# Patient Record
Sex: Male | Born: 1950
Health system: Southern US, Community
[De-identification: ages and names within clinical notes are randomized; demographics above are authoritative.]

## PROBLEM LIST (undated history)

## (undated) DIAGNOSIS — J449 Chronic obstructive pulmonary disease, unspecified: Secondary | ICD-10-CM

## (undated) DIAGNOSIS — A630 Anogenital (venereal) warts: Secondary | ICD-10-CM

## (undated) DIAGNOSIS — Z720 Tobacco use: Secondary | ICD-10-CM

## (undated) HISTORY — DX: Chronic obstructive pulmonary disease, unspecified: J44.9

## (undated) HISTORY — DX: Tobacco use: Z72.0

## (undated) HISTORY — DX: Anogenital (venereal) warts: A63.0

---

## 2000-02-02 HISTORY — PX: NOSE SURGERY: SHX723

## 2007-03-30 ENCOUNTER — Ambulatory Visit: Payer: Self-pay | Admitting: Family Medicine

## 2007-03-30 DIAGNOSIS — S43439A Superior glenoid labrum lesion of unspecified shoulder, initial encounter: Secondary | ICD-10-CM | POA: Insufficient documentation

## 2007-04-11 ENCOUNTER — Encounter: Payer: Self-pay | Admitting: Family Medicine

## 2007-04-13 ENCOUNTER — Ambulatory Visit: Payer: Self-pay | Admitting: Family Medicine

## 2007-04-18 ENCOUNTER — Telehealth (INDEPENDENT_AMBULATORY_CARE_PROVIDER_SITE_OTHER): Payer: Self-pay | Admitting: *Deleted

## 2007-04-20 ENCOUNTER — Encounter: Admission: RE | Admit: 2007-04-20 | Discharge: 2007-04-20 | Payer: Self-pay | Admitting: Sports Medicine

## 2007-04-20 ENCOUNTER — Encounter: Payer: Self-pay | Admitting: Family Medicine

## 2007-04-22 ENCOUNTER — Encounter: Admission: RE | Admit: 2007-04-22 | Discharge: 2007-04-22 | Payer: Self-pay | Admitting: Sports Medicine

## 2007-05-03 ENCOUNTER — Encounter: Payer: Self-pay | Admitting: Family Medicine

## 2007-05-05 ENCOUNTER — Encounter: Admission: RE | Admit: 2007-05-05 | Discharge: 2007-06-23 | Payer: Self-pay | Admitting: Sports Medicine

## 2007-05-08 ENCOUNTER — Encounter: Payer: Self-pay | Admitting: Family Medicine

## 2007-05-08 DIAGNOSIS — M7500 Adhesive capsulitis of unspecified shoulder: Secondary | ICD-10-CM | POA: Insufficient documentation

## 2008-01-31 ENCOUNTER — Ambulatory Visit: Payer: Self-pay | Admitting: Family Medicine

## 2008-12-05 ENCOUNTER — Ambulatory Visit: Payer: Self-pay | Admitting: Family Medicine

## 2008-12-25 ENCOUNTER — Ambulatory Visit: Payer: Self-pay | Admitting: Family Medicine

## 2008-12-25 DIAGNOSIS — D126 Benign neoplasm of colon, unspecified: Secondary | ICD-10-CM | POA: Insufficient documentation

## 2009-01-01 ENCOUNTER — Encounter: Payer: Self-pay | Admitting: Family Medicine

## 2009-01-01 LAB — CONVERTED CEMR LAB
Albumin: 4.7 g/dL (ref 3.5–5.2)
Alkaline Phosphatase: 68 units/L (ref 39–117)
BUN: 12 mg/dL (ref 6–23)
Glucose, Bld: 87 mg/dL (ref 70–99)
HDL: 63 mg/dL (ref >39)
LDL Cholesterol: 144 mg/dL — ABNORMAL HIGH (ref 0–99)
Potassium: 4.8 meq/L (ref 3.5–5.3)
Triglycerides: 66 mg/dL (ref <150)

## 2009-02-21 ENCOUNTER — Encounter: Payer: Self-pay | Admitting: Family Medicine

## 2009-02-26 ENCOUNTER — Encounter: Payer: Self-pay | Admitting: Family Medicine

## 2009-02-27 ENCOUNTER — Telehealth: Payer: Self-pay | Admitting: Family Medicine

## 2009-03-12 ENCOUNTER — Encounter: Payer: Self-pay | Admitting: Family Medicine

## 2009-03-12 DIAGNOSIS — A630 Anogenital (venereal) warts: Secondary | ICD-10-CM | POA: Insufficient documentation

## 2009-03-12 DIAGNOSIS — K5730 Diverticulosis of large intestine without perforation or abscess without bleeding: Secondary | ICD-10-CM | POA: Insufficient documentation

## 2010-03-03 NOTE — Procedures (Signed)
Summary: Colonoscopy/[facility]  Colonoscopy/[facility]   Imported By: Lanelle Bal 03/13/2009 13:04:15  _____________________________________________________________________  External Attachment:    Type:   Image     Comment:   External Document

## 2010-03-03 NOTE — Miscellaneous (Signed)
Summary: colonoscopy: polyps  Clinical Lists Changes  Problems: Removed problem of HEALTH MAINTENANCE EXAM (ICD-V70.0) Removed problem of OTH&UNSPEC ENDOCRN NUTRIT METAB&IMMUNITY D/O (ICD-V77.99) Removed problem of SCREENING FOR LIPOID DISORDERS (ICD-V77.91) Removed problem of HERPANGINA (ICD-074.0) Removed problem of NEED PROPHYLACTIC VACCINATION&INOCULATION FLU (ICD-V04.81) Removed problem of NEVUS, ATYPICAL (ICD-216.9) Added new problem of DIVERTICULOSIS OF COLON (ICD-562.10) Added new problem of CONDYLOMA ACUMINATA, ANAL (ICD-078.11) Observations: Added new observation of COLONRECACT: Repeat colonoscopy in 5 years.  (02/21/2009 8:15) Added new observation of COLONOSCOPY: Location:  [facility] Dr Bearl [place] -- biopsied showed condyloma acuminata sigmoid colon polyp- hyperplastic transverse colon polyp-tubular adenoma diverticulosis  (02/21/2009 8:15)      Colonoscopy  Procedure date:  02/21/2009  Findings:      Location:  [facility] Dr Bearl [place] -- biopsied showed condyloma acuminata sigmoid colon polyp- hyperplastic transverse colon polyp-tubular adenoma diverticulosis   Comments:      Repeat colonoscopy in 5 years.    Colonoscopy  Procedure date:  02/21/2009  Findings:      Location:  [facility] Dr Bearl [place] -- biopsied showed condyloma acuminata sigmoid colon polyp- hyperplastic transverse colon polyp-tubular adenoma diverticulosis   Comments:      Repeat colonoscopy in 5 years.

## 2010-03-03 NOTE — Letter (Signed)
Summary: Letter to Patient Regarding Colonoscopy & Path Results/[facility]  Letter to Patient Regarding Colonoscopy & Path Results/[facility]   Imported By: Lanelle Bal 03/13/2009 13:02:58  _____________________________________________________________________  External Attachment:    Type:   Image     Comment:   External Document

## 2010-03-03 NOTE — Progress Notes (Signed)
Summary: Smoking cessation  Phone Note Call from Patient   Caller: Patient Summary of Call: Pt and spouse would like to start smoking cessation. They would like Chantix called to pharm. Please advise . Initial call taken by: Payton Spark CMA,  February 27, 2009 3:45 PM    New/Updated Medications: CHANTIX STARTING MONTH PAK 0.5 MG X 11 & 1 MG X 42 TABS (VARENICLINE TARTRATE) take as directed Prescriptions: CHANTIX STARTING MONTH PAK 0.5 MG X 11 & 1 MG X 42 TABS (VARENICLINE TARTRATE) take as directed  #1 pack x 0   Entered and Authorized by:   Seymour Bars DO   Signed by:   Seymour Bars DO on 02/27/2009   Method used:   Electronically to        [id number]       [address]       [place], [place]  [address]       Ph: [phone]       Fax: [phone]   RxID:   [id number]

## 2010-04-09 ENCOUNTER — Other Ambulatory Visit: Payer: Self-pay | Admitting: Sports Medicine

## 2010-04-09 ENCOUNTER — Ambulatory Visit
Admission: RE | Admit: 2010-04-09 | Discharge: 2010-04-09 | Disposition: A | Discharge status: Home / Self Care | Payer: BC Managed Care – PPO | Source: Ambulatory Visit | Attending: Sports Medicine | Admitting: Sports Medicine

## 2010-04-09 DIAGNOSIS — M25562 Pain in left knee: Secondary | ICD-10-CM

## 2010-06-26 ENCOUNTER — Ambulatory Visit (INDEPENDENT_AMBULATORY_CARE_PROVIDER_SITE_OTHER): Payer: BC Managed Care – PPO | Admitting: Family Medicine

## 2010-06-26 ENCOUNTER — Encounter: Payer: Self-pay | Admitting: Family Medicine

## 2010-06-26 VITALS — BP 130/87 | HR 80 | Temp 98.5°F | Ht 72.0 in | Wt 176.0 lb

## 2010-06-26 DIAGNOSIS — L723 Sebaceous cyst: Secondary | ICD-10-CM

## 2010-06-26 DIAGNOSIS — L729 Follicular cyst of the skin and subcutaneous tissue, unspecified: Secondary | ICD-10-CM

## 2010-06-26 MED ORDER — IMIQUIMOD 5 % EX CREA: TOPICAL_CREAM | CUTANEOUS | Status: DC

## 2010-06-26 NOTE — Progress Notes (Signed)
  Subjective:    Patient ID: Bobby Lewis, male    DOB: [birth year], 60 y.o.   MRN: [id number]  HPI 60 yo WM presents for a spot on the bottom of his R foot that he thinks has been there for a year.  It is not painful and he can stand and walk w/o a problem.  He thinks it has grown in size.  Denies skin color changes, etc.  O/w doing well.  Quit smoking 2 yrs ago.  BP 130/87  Pulse 80  Temp(Src) 98.5 F (36.9 C) (Oral)  Ht 6' (1.829 m)  Wt 176 lb (79.833 kg)  BMI 23.87 kg/m2  SpO2 96%     Review of Systems as per HPI     Objective:   Physical Exam   Ext:  R plantar surface of the foot with a cystic type mass that is non-tender and about nickel sized just lateral to the median arch.  Well demarcated and slightly hard.  No overlying callus or ulceration.  No change in skin coloration.  Duputren's contracture R hand.  Assessment & Plan:  Cyst on Foot- Non tender and unlikely to be anything worrisome but will have Dr Yates Decamp take a look at it to see if it needs removal. Will f/u for his hand, labs and a physical in 1 month.

## 2010-06-26 NOTE — Patient Instructions (Signed)
Will get you in with Dr Yates Decamp for ? Cyst on foot.  Return for physical with fasting labs in 1 month.

## 2010-07-21 ENCOUNTER — Encounter: Payer: Self-pay | Admitting: Family Medicine

## 2010-07-22 ENCOUNTER — Encounter: Payer: Self-pay | Admitting: Family Medicine

## 2010-07-22 ENCOUNTER — Ambulatory Visit (INDEPENDENT_AMBULATORY_CARE_PROVIDER_SITE_OTHER): Payer: BC Managed Care – PPO | Admitting: Family Medicine

## 2010-07-22 DIAGNOSIS — Z125 Encounter for screening for malignant neoplasm of prostate: Secondary | ICD-10-CM

## 2010-07-22 DIAGNOSIS — Z13228 Encounter for screening for other metabolic disorders: Secondary | ICD-10-CM

## 2010-07-22 DIAGNOSIS — Z130 Encounter for screening for diseases of the blood and blood-forming organs and certain disorders involving the immune mechanism: Secondary | ICD-10-CM

## 2010-07-22 DIAGNOSIS — Z0000 Encounter for general adult medical examination without abnormal findings: Secondary | ICD-10-CM

## 2010-07-22 DIAGNOSIS — Z13220 Encounter for screening for lipoid disorders: Secondary | ICD-10-CM

## 2010-07-22 NOTE — Patient Instructions (Signed)
Update fasting labs. Will call you w/ results.

## 2010-07-22 NOTE — Progress Notes (Signed)
  Subjective:    Patient ID: Bobby Lewis, male    DOB: 1950/08/12, 60 y.o.   MRN: [id number]  HPI 60 yo WM presents for CPE.  He is healthy w/o any complaints.  He denies a fam hx of premature heart disease, prostate cancer or colon cancer.    His last labwork was > 1 yr ago. His last tetanus vaccine was >10 yrs ago. His last colonoscopy was in 2011 and was given 5 yrs to f/u.  He recently quit smoking and is doing rather well off cigarettes.  He is seeing Dr Yates Decamp now for R foot pain.  He is physically active with a fairly healthy diet.  He had a CXR done 1-2 yrs ago and cardiac testing a few years ago prior to coming here that was 'normal'.    BP 132/80  Pulse 78  Ht 6' (1.829 m)  Wt 176 lb (79.833 kg)  BMI 23.87 kg/m2  SpO2 96%    Review of Systems Gen: no fevers, chills, hot flashes, night sweats, change in weight GI: no N/V/C/D GU: no dysuria, incontinence or sexual dysfunction CV: no chest pain, DOE, palpitations s or edema Pulm:  Denies CP, SOB or chronic cough      Objective:   Physical Exam  Genitourinary: Guaiac negative stool. Prostate is enlarged (2+). Prostate is not tender.       Anal condyloma      Gen: alert, well groomed in NAD Neck: no thyromegaly or cervical lymphadenopathy CV: RRR w/o murmur, no audible carotid bruits or abdominal aortic bruits Ext: no edema, clubbing or cyanosis Lungs: CTA bilat w/o W/R/R; nonlabored HEENT:  [place]/AT; PERRLA; oropharynx pink and moist with good dentition Abd: soft, NT, ND, NABS, No HSM, no audible AA bruits Skin: warm and dry; no rash, pallor or jaundice, sun damaged skin Psych: does not appear anxious or depressed; answers questions appropriately     Assessment & Plan:  Assesment:  1. CPE- Keeping healthy checklist for men reviewed today.  BP at goal.  BMI 23 in the normal range.     Labs ordered Colonoscopy due in 4 yrs.  prostate exam done today, PSA with labs. Tdap UTD Encouraged healthy diet, regular  exercise, MVI daily. Return for next physical in 1 yr.

## 2010-07-28 LAB — LIPID PANEL: Cholesterol: 297 mg/dL — ABNORMAL HIGH (ref 0–200)

## 2010-07-29 ENCOUNTER — Other Ambulatory Visit: Payer: Self-pay | Admitting: Family Medicine

## 2010-07-29 ENCOUNTER — Telehealth: Payer: Self-pay | Admitting: Family Medicine

## 2010-07-29 DIAGNOSIS — E785 Hyperlipidemia, unspecified: Secondary | ICD-10-CM | POA: Insufficient documentation

## 2010-07-29 LAB — COMPLETE METABOLIC PANEL WITH GFR
Albumin: 4.9 g/dL (ref 3.5–5.2)
Alkaline Phosphatase: 78 U/L (ref 39–117)
BUN: 12 mg/dL (ref 6–23)
CO2: 24 mEq/L (ref 19–32)
Calcium: 9.7 mg/dL (ref 8.4–10.5)
Chloride: 106 mEq/L (ref 96–112)
GFR, Est African American: >60 mL/min (ref >60)
GFR, Est Non African American: >60 mL/min (ref >60)
Glucose, Bld: 95 mg/dL (ref 70–99)
Potassium: 4.8 mEq/L (ref 3.5–5.3)

## 2010-07-29 MED ORDER — ATORVASTATIN CALCIUM 80 MG PO TABS: 80.0000 mg | ORAL_TABLET | Freq: Every day | ORAL | Status: DC

## 2010-07-29 NOTE — Telephone Encounter (Signed)
Pls let pt know that his fasting sugar, liver and kidney function came back normal.  His cholesterol is very high at 297 with high TGs of 193 and a very high LDL bad cholesterol of 207.  This should be < 130.  Prostate cancer screen is normal.  We need to start him on cholesterol reducing medication to reduce risk for heart attack and stroke and recheck labs in 8 wks.  Let me know if any questions.  RX will be sent to pharmacy.  Take at bedtime daily.

## 2010-07-31 NOTE — Telephone Encounter (Signed)
LMOM for Pt to CB 

## 2010-07-31 NOTE — Telephone Encounter (Signed)
Pt aware of the below

## 2010-10-26 ENCOUNTER — Telehealth: Payer: Self-pay | Admitting: Family Medicine

## 2010-10-26 DIAGNOSIS — E785 Hyperlipidemia, unspecified: Secondary | ICD-10-CM

## 2010-10-26 NOTE — Telephone Encounter (Signed)
Pt's wife calling saying he needs lab orders.  Was told he needed updated labwork. Plan:  Pt wife notified that lab order printed and faxed to [facility] for the fasting lipid profile. Jarvis Newcomer, LPN [facility]

## 2010-10-27 LAB — LIPID PANEL
Cholesterol: 254 mg/dL — ABNORMAL HIGH (ref 0–200)
HDL: 52 mg/dL (ref >39)
Total CHOL/HDL Ratio: 4.9 Ratio
Triglycerides: 134 mg/dL (ref <150)
VLDL: 27 mg/dL (ref 0–40)

## 2010-10-28 ENCOUNTER — Telehealth: Payer: Self-pay | Admitting: *Deleted

## 2010-10-28 MED ORDER — PITAVASTATIN CALCIUM 4 MG PO TABS: 4.0000 mg | ORAL_TABLET | Freq: Every day | ORAL | Status: DC

## 2010-10-28 NOTE — Telephone Encounter (Signed)
Message copied by Lanae Crumbly on Wed Oct 28, 2010  8:50 AM ------      Message from: Nani Gasser D      Created: Wed Oct 28, 2010  7:58 AM       LDL chol is better at 175 but still needs to be under 100. Will change to livalo.  Can pick up samples and coupon and we can send a new rx and recheck levels in 8 weeks. OK to send livalo 4mg  daily at bedtime.

## 2010-10-28 NOTE — Telephone Encounter (Signed)
Pt called for his lab results.   Plan:  Pt informed LDL still too elevated.  Pt informed to pup livalo 4 mg # 7 samples and to pup coupon card to take to pharm to put with script that has already been sent.   Pt concerned about the cost of the chol med.  Told have no idea of cost until its ran.  Told pt he doesn't have to accept the sccipt if too expensive after using the coupon card.  Pt informed to repeat the levels in 8 weeks. Bobby Newcomer, LPN [facility]

## 2010-10-28 NOTE — Telephone Encounter (Signed)
LMOM for pt to return call. 

## 2010-11-03 ENCOUNTER — Telehealth: Payer: Self-pay | Admitting: Family Medicine

## 2010-11-03 NOTE — Telephone Encounter (Signed)
Pt's wife called and said the pharm does not have the pt livalo script that was sent in on 10-28-10 for # 7. Plan:  Called the pharm and gave verbal order. Jarvis Newcomer, LPN [facility]

## 2010-11-04 ENCOUNTER — Telehealth: Payer: Self-pay | Admitting: Family Medicine

## 2010-11-04 MED ORDER — PITAVASTATIN CALCIUM 4 MG PO TABS: 4.0000 mg | ORAL_TABLET | Freq: Every day | ORAL | Status: DC

## 2010-11-04 NOTE — Telephone Encounter (Signed)
[facility] called and left message on the triage nurse voice line stating that pt coupon for the livalo 4 mg is for 30 days and not 7 days and they need the prescription changed. Plan:  Previous script discontinued due to change and a new script for # 30/ 0 refills was sent electronically. Jarvis Newcomer, LPN [facility]

## 2010-11-06 ENCOUNTER — Telehealth: Payer: Self-pay | Admitting: Family Medicine

## 2010-11-06 NOTE — Telephone Encounter (Signed)
Pt's wife called and said her husband was given new script for livalo for his cholesterol.  Cost is $29.00 for 7 day supply.  Pt cannot afford, and the wife wants something cheaper prescribed. Jarvis Newcomer, LPN [facility]

## 2010-11-06 NOTE — Telephone Encounter (Signed)
Did they use the coupon card?

## 2010-11-09 ENCOUNTER — Telehealth: Payer: Self-pay | Admitting: Family Medicine

## 2010-11-09 MED ORDER — ATORVASTATIN CALCIUM 80 MG PO TABS: 80.0000 mg | ORAL_TABLET | Freq: Every day | ORAL | Status: DC

## 2010-11-09 NOTE — Telephone Encounter (Signed)
LMOM for the pt's wife instructing her to call with details if used livalo coupon or not. Pending pt call back. Jarvis Newcomer, LPN [facility]

## 2010-11-09 NOTE — Telephone Encounter (Signed)
Pt's wife notified and told to check with the pharm later and that atorvastatin generic lipitor had been sent to pharm. Jarvis Newcomer, LPN [facility]

## 2010-11-09 NOTE — Telephone Encounter (Signed)
Being off the med for that long changes everything. That is why i wanted to use a stronger med because I thought he was already on his lipitor. Simvastatin won't be strong enough to bring his LDL down by 40%. REally needs a lipitor or crestor or livalo.Lipitor now comes generic so Ok fo 80mg  daily and then recheck chol in 3 mo on the med.

## 2010-11-09 NOTE — Telephone Encounter (Signed)
Pt's wife called back and said they did use the coupon and even with 30 day supply the cost of med per mth is $18.00, and that is still more than she wants to pay for a 30 day script.  Would like a med that the copay is going to be $5.00.  Pt's wife suggest simvastatin.  That is what she takes.  Also, pt's wife states when pt had labs drawn he had let hisself run out of medication 1 mth  or longer prior to doing labwork. Plan:  Routed this encounter to the provider. Jarvis Newcomer, LPN [facility]

## 2010-11-10 NOTE — Telephone Encounter (Signed)
Closed

## 2011-01-08 ENCOUNTER — Other Ambulatory Visit: Payer: Self-pay | Admitting: Family Medicine

## 2011-03-08 ENCOUNTER — Other Ambulatory Visit: Payer: Self-pay | Admitting: Family Medicine

## 2011-05-11 ENCOUNTER — Other Ambulatory Visit: Payer: Self-pay | Admitting: Family Medicine

## 2011-05-27 ENCOUNTER — Other Ambulatory Visit: Payer: Self-pay | Admitting: Family Medicine

## 2011-07-05 ENCOUNTER — Other Ambulatory Visit: Payer: Self-pay | Admitting: Family Medicine

## 2011-08-08 ENCOUNTER — Other Ambulatory Visit: Payer: Self-pay | Admitting: Family Medicine

## 2011-08-09 NOTE — Telephone Encounter (Signed)
Needs appoinment

## 2011-08-29 ENCOUNTER — Other Ambulatory Visit: Payer: Self-pay | Admitting: Family Medicine

## 2011-08-30 NOTE — Telephone Encounter (Signed)
Must make appointment before any further refills 

## 2011-09-05 ENCOUNTER — Other Ambulatory Visit: Payer: Self-pay | Admitting: Family Medicine

## 2011-09-06 ENCOUNTER — Other Ambulatory Visit: Payer: Self-pay | Admitting: Family Medicine

## 2011-10-11 ENCOUNTER — Other Ambulatory Visit: Payer: Self-pay | Admitting: Family Medicine

## 2011-10-13 ENCOUNTER — Other Ambulatory Visit: Payer: Self-pay | Admitting: Family Medicine

## 2011-10-16 ENCOUNTER — Other Ambulatory Visit: Payer: Self-pay | Admitting: Family Medicine

## 2011-10-18 ENCOUNTER — Ambulatory Visit (INDEPENDENT_AMBULATORY_CARE_PROVIDER_SITE_OTHER): Payer: BC Managed Care – PPO | Admitting: Family Medicine

## 2011-10-18 ENCOUNTER — Encounter: Payer: Self-pay | Admitting: Family Medicine

## 2011-10-18 VITALS — BP 154/91 | HR 64 | Wt 185.0 lb

## 2011-10-18 DIAGNOSIS — R030 Elevated blood-pressure reading, without diagnosis of hypertension: Secondary | ICD-10-CM

## 2011-10-18 DIAGNOSIS — IMO0001 Reserved for inherently not codable concepts without codable children: Secondary | ICD-10-CM

## 2011-10-18 DIAGNOSIS — Z23 Encounter for immunization: Secondary | ICD-10-CM

## 2011-10-18 DIAGNOSIS — E785 Hyperlipidemia, unspecified: Secondary | ICD-10-CM

## 2011-10-18 MED ORDER — ATORVASTATIN CALCIUM 80 MG PO TABS: 80.0000 mg | ORAL_TABLET | Freq: Every day | ORAL | Status: DC

## 2011-10-18 NOTE — Progress Notes (Signed)
  Subjective:    Patient ID: Bobby Lewis, male    DOB: 1950-10-03, 61 y.o.   MRN: [id number]  HPI Hyperlipidemia  - Out of meds for now.  Was told needed appt. No CP or SOB.  He had not been seen since 2011. He otherwise tolerates his medications well and denies any myalgias on the atorvastatin. He was evidently switched a Livalo last fall but because of cost will switch back to atorvastatin 80 mg. He says even try to use a coupon card for Livalo but was still too expensive.   Review of Systems     Objective:   Physical Exam  Constitutional: He is oriented to person, place, and time. He appears well-developed and well-nourished.  HENT:  Head: Normocephalic and atraumatic.  Cardiovascular: Normal rate, regular rhythm and normal heart sounds.   Pulmonary/Chest: Effort normal and breath sounds normal.  Neurological: He is alert and oriented to person, place, and time.  Skin: Skin is warm and dry.  Psychiatric: He has a normal mood and affect. His behavior is normal.          Assessment & Plan:  Hyperlpidemia - Restart lipitor. Discussed increased risk of myalgias on the this dose. Says couldn't afford the livalo.  Restart med and then go in 2 weeks to check lipids and CMP.    Elevated BP- High today. REpeat BP in 2 weeks with nurse visit.    Flu shot and shingle vaccine given today.

## 2011-12-01 ENCOUNTER — Ambulatory Visit (INDEPENDENT_AMBULATORY_CARE_PROVIDER_SITE_OTHER): Payer: BC Managed Care – PPO | Admitting: Family Medicine

## 2011-12-01 VITALS — BP 137/76 | HR 66 | Ht 72.0 in | Wt 191.0 lb

## 2011-12-01 DIAGNOSIS — I10 Essential (primary) hypertension: Secondary | ICD-10-CM

## 2011-12-01 LAB — COMPLETE METABOLIC PANEL WITH GFR
AST: 24 U/L (ref 0–37)
Alkaline Phosphatase: 69 U/L (ref 39–117)
BUN: 13 mg/dL (ref 6–23)
Calcium: 9.7 mg/dL (ref 8.4–10.5)
Creat: 0.92 mg/dL (ref 0.50–1.35)
GFR, Est Non African American: >89 mL/min
Glucose, Bld: 95 mg/dL (ref 70–99)

## 2011-12-01 LAB — LIPID PANEL
Cholesterol: 149 mg/dL (ref 0–200)
Total CHOL/HDL Ratio: 3.3 Ratio
Triglycerides: 213 mg/dL — ABNORMAL HIGH (ref <150)
VLDL: 43 mg/dL — ABNORMAL HIGH (ref 0–40)

## 2011-12-01 NOTE — Progress Notes (Signed)
Patient ID: Bobby Lewis, male   DOB: 04/18/1950, 61 y.o.   MRN: [id number]  Pt denies chest pain, SOB, dizziness, or heart palpitations.   5 min spent with pt.   HTN- blood pressure does look better today. We will continue to monitor. Recommend repeat in 6 months. Also please make sure that he is going to the lab to recheck his cholesterol since restarting his Lipitor. Nani Gasser, MD

## 2011-12-01 NOTE — Progress Notes (Signed)
Patient ID: Bobby Lewis, male   DOB: Nov 28, 1950, 61 y.o.   MRN: [id number] Pt aware

## 2012-01-03 ENCOUNTER — Telehealth: Payer: Self-pay | Admitting: *Deleted

## 2012-01-03 MED ORDER — OMEPRAZOLE 20 MG PO TBEC: 1.0000 | DELAYED_RELEASE_TABLET | Freq: Every day | ORAL | Status: DC

## 2012-01-03 NOTE — Telephone Encounter (Signed)
Pt states having some heartburn lateley and has used in the past tums, etc. Wife picked up Omeprazole OTC and this helped a lot. Request a rx be sent in  For this because this will save him some money. [facility]ses [facility]

## 2012-01-03 NOTE — Telephone Encounter (Signed)
rx sent

## 2012-01-12 ENCOUNTER — Other Ambulatory Visit: Payer: Self-pay | Admitting: Family Medicine

## 2012-02-13 ENCOUNTER — Other Ambulatory Visit: Payer: Self-pay | Admitting: Family Medicine

## 2012-03-14 ENCOUNTER — Other Ambulatory Visit: Payer: Self-pay | Admitting: Family Medicine

## 2012-04-15 ENCOUNTER — Other Ambulatory Visit: Payer: Self-pay | Admitting: Family Medicine

## 2012-05-16 ENCOUNTER — Other Ambulatory Visit: Payer: Self-pay | Admitting: Family Medicine

## 2012-05-29 ENCOUNTER — Other Ambulatory Visit: Payer: Self-pay | Admitting: Family Medicine

## 2012-06-11 ENCOUNTER — Other Ambulatory Visit: Payer: Self-pay | Admitting: Family Medicine

## 2012-06-12 NOTE — Telephone Encounter (Signed)
Needs appointment

## 2012-07-19 ENCOUNTER — Other Ambulatory Visit: Payer: Self-pay | Admitting: *Deleted

## 2012-07-19 MED ORDER — ATORVASTATIN CALCIUM 80 MG PO TABS: 80.0000 mg | ORAL_TABLET | Freq: Every day | ORAL | Status: DC

## 2012-08-01 ENCOUNTER — Encounter: Payer: Self-pay | Admitting: Family Medicine

## 2012-08-01 ENCOUNTER — Ambulatory Visit (INDEPENDENT_AMBULATORY_CARE_PROVIDER_SITE_OTHER): Payer: BC Managed Care – PPO | Admitting: Family Medicine

## 2012-08-01 VITALS — BP 126/82 | HR 54 | Ht 72.0 in | Wt 190.0 lb

## 2012-08-01 DIAGNOSIS — M25879 Other specified joint disorders, unspecified ankle and foot: Secondary | ICD-10-CM

## 2012-08-01 DIAGNOSIS — E785 Hyperlipidemia, unspecified: Secondary | ICD-10-CM

## 2012-08-01 DIAGNOSIS — K219 Gastro-esophageal reflux disease without esophagitis: Secondary | ICD-10-CM

## 2012-08-01 MED ORDER — ATORVASTATIN CALCIUM 80 MG PO TABS: 80.0000 mg | ORAL_TABLET | Freq: Every day | ORAL | Status: DC

## 2012-08-01 NOTE — Progress Notes (Signed)
  Subjective:    Patient ID: Bobby Lewis, male    DOB: 1950/12/12, 62 y.o.   MRN: [id number]  HPI Hyperlipidemia - Pt denies chest pain, SOB, dizziness, or heart palpitations.  Taking meds as directed w/o problems.  Denies medication side effects.  Does have some MSk pain but says exercise seem to help.   Had cyst on the right foot near the ball of his foot.  Has been seen by podiatry twice. Once a few years ago. Did have an [place] that showed a cyst  Once had an injection and not sure if helped or not.  His wife thinks it may be larger. Says has bothered him more since has been working out regularly.   GERd - well controlled prilosec. On it daily.  No breakthrough sxs.   Review of Systems     Objective:   Physical Exam  Constitutional: He is oriented to person, place, and time. He appears well-developed and well-nourished.  HENT:  Head: Normocephalic and atraumatic.  Cardiovascular: Normal rate, regular rhythm and normal heart sounds.   Pulmonary/Chest: Effort normal and breath sounds normal.  Musculoskeletal:  approx 2 cm cyst on the bottom of the right foot. Nontender. No erythema. Firm but not rock hard.  Neurological: He is alert and oriented to person, place, and time.  Skin: Skin is warm and dry.  Psychiatric: He has a normal mood and affect. His behavior is normal.          Assessment & Plan:  Hyperlipidemia - Due to recheck liver and lipids.  Given labslip today.  Cyst on foot-Refer back to podiatry to eval for excision.    GERD  - well controlled.    Has CPE schedule next week.

## 2012-08-10 LAB — COMPLETE METABOLIC PANEL WITH GFR
Albumin: 4.9 g/dL (ref 3.5–5.2)
Alkaline Phosphatase: 58 U/L (ref 39–117)
CO2: 27 mEq/L (ref 19–32)
Calcium: 9.9 mg/dL (ref 8.4–10.5)
Chloride: 106 mEq/L (ref 96–112)
GFR, Est African American: 87 mL/min
GFR, Est Non African American: 75 mL/min
Glucose, Bld: 93 mg/dL (ref 70–99)
Potassium: 5 mEq/L (ref 3.5–5.3)
Sodium: 142 mEq/L (ref 135–145)
Total Protein: 7.1 g/dL (ref 6.0–8.3)

## 2012-08-10 LAB — LIPID PANEL: Total CHOL/HDL Ratio: 3 Ratio

## 2012-08-11 ENCOUNTER — Encounter: Payer: Self-pay | Admitting: Family Medicine

## 2012-08-11 NOTE — Progress Notes (Signed)
Quick Note:  All labs are normal. ______ 

## 2012-08-14 ENCOUNTER — Other Ambulatory Visit: Payer: Self-pay | Admitting: Family Medicine

## 2012-08-15 ENCOUNTER — Ambulatory Visit (INDEPENDENT_AMBULATORY_CARE_PROVIDER_SITE_OTHER): Payer: BC Managed Care – PPO | Admitting: Family Medicine

## 2012-08-15 ENCOUNTER — Encounter: Payer: Self-pay | Admitting: Family Medicine

## 2012-08-15 VITALS — BP 144/86 | HR 57 | Ht 72.0 in | Wt 189.0 lb

## 2012-08-15 DIAGNOSIS — Z125 Encounter for screening for malignant neoplasm of prostate: Secondary | ICD-10-CM

## 2012-08-15 DIAGNOSIS — Z0000 Encounter for general adult medical examination without abnormal findings: Secondary | ICD-10-CM

## 2012-08-15 DIAGNOSIS — Z23 Encounter for immunization: Secondary | ICD-10-CM

## 2012-08-15 MED ORDER — IMIQUIMOD 5 % EX CREA: TOPICAL_CREAM | CUTANEOUS | Status: DC

## 2012-08-15 NOTE — Progress Notes (Signed)
Subjective:    Patient ID: Bobby Lewis, male    DOB: [birth year], 62 y.o.   MRN: [id number]  HPI Here for CPE today.  No specific concerns. Wants to go over labs.  He is taking his statin and tolerating it well. He does request a refill on his Aldara. He is taking his statin as well as superficial tabs daily. He's also taking a baby aspirin. He does use his reflux medication fairly frequently. He is due for his tetanus vaccine. Colonoscopy is up-to-date.   Review of Systems comprehensive ROS is neg.  BP 144/86  Pulse 57  Ht 6' (1.829 m)  Wt 189 lb (85.73 kg)  BMI 25.63 kg/m2    No Known Allergies  Past Medical History  Diagnosis Date  . Genital warts     history  . Tobacco abuse   . COPD (chronic obstructive pulmonary disease)     Past Surgical History  Procedure Laterality Date  . Nose surgery  2002    History   Social History  . Marital Status: Married    Spouse Name: N/A    Number of Children: N/A  . Years of Education: N/A   Occupational History  . Not on file.   Social History Main Topics  . Smoking status: Former Smoker -- 1.00 packs/day for 25 years    Types: Cigarettes    Quit date: 02/01/2009  . Smokeless tobacco: Not on file  . Alcohol Use: 1.5 oz/week    3 drink(s) per week     Comment: daily  . Drug Use: No  . 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   Other Topics Concern  . Not on file   Social History Narrative  . No narrative on file    Family History  Problem Relation Age of Onset  . Joint hypermobility Mother   . Heart attack Father 76    died 34    Outpatient Encounter Prescriptions as of 08/15/2012  Medication Sig Dispense Refill  . aspirin 81 MG tablet Take 81 mg by mouth daily.        Marland Kitchen atorvastatin (LIPITOR) 80 MG tablet Take 1 tablet (80 mg total) by mouth daily.  30 tablet  3  . imiquimod (ALDARA) 5 % cream        . Multiple Vitamin (MULTIVITAMIN PO) Take by mouth.        Marland Kitchen omeprazole (PRILOSEC) 20 MG capsule TAKE ONE CAPSULE BY MOUTH EVERY DAY  30 capsule  2   No facility-administered encounter medications on file as of 08/15/2012.          Objective:   Physical Exam  Constitutional: He is oriented to person, place, and time. He appears well-developed and well-nourished.  HENT:  Head: Normocephalic and atraumatic.  Right Ear: External ear normal.  Left Ear: External ear normal.  Nose: Nose normal.  Mouth/Throat: Oropharynx is clear and moist.  Eyes: Conjunctivae and EOM are normal. Pupils are equal, round, and reactive to light.  Neck: Normal range of motion. Neck supple. No thyromegaly present.  Cardiovascular: Normal rate, regular rhythm, normal heart sounds and intact distal pulses.   Pulmonary/Chest: Effort normal and breath sounds normal.  Abdominal: Soft. Bowel sounds are normal. He exhibits no distension and no mass. There is no tenderness. There is no rebound and no guarding.  Musculoskeletal: Normal range of  motion.  Lymphadenopathy:    He has no cervical adenopathy.  Neurological: He is alert and oriented to person, place, and time. He has normal reflexes.  Skin: Skin is warm and dry.  Psychiatric: He has a normal mood and affect. His behavior is normal. Judgment and thought content normal.          Assessment & Plan:  CPE -  Keep up a regular exercise program and make sure you are eating a healthy diet Try to eat 4 servings of dairy a day, or if you are lactose intolerant take a calcium with vitamin D daily.  Your vaccines are up to date.   Tdap updated today Reviewed lab results with him.  Hyperlipidemia-tolerating statin well and levels look fantastic. Recheck liver and lipids in one year.  Condyloma acuminata, anal-refill Aldara cream. He uses it as needed.   Discussed pros and cons of prostate cancer screening. He opted to have a PSA checked. Lab slip  given today he can go anytime.

## 2012-10-19 LAB — PSA: PSA: 0.25 ng/mL (ref <=4.00)

## 2012-11-09 ENCOUNTER — Ambulatory Visit (INDEPENDENT_AMBULATORY_CARE_PROVIDER_SITE_OTHER): Payer: BC Managed Care – PPO | Admitting: Family Medicine

## 2012-11-09 DIAGNOSIS — Z23 Encounter for immunization: Secondary | ICD-10-CM

## 2012-11-09 NOTE — Progress Notes (Signed)
  Subjective:    Patient ID: Bobby Lewis, male    DOB: [birth year], 62 y.o.   MRN: [id number] Flu shot given IM left deltoid.  No complications.  Donne Anon, CMA HPI    Review of Systems     Objective:   Physical Exam        Assessment & Plan:

## 2012-11-30 ENCOUNTER — Other Ambulatory Visit: Payer: Self-pay | Admitting: Family Medicine

## 2012-12-14 ENCOUNTER — Other Ambulatory Visit: Payer: Self-pay | Admitting: Family Medicine

## 2013-03-01 ENCOUNTER — Other Ambulatory Visit: Payer: Self-pay | Admitting: Family Medicine

## 2013-04-03 ENCOUNTER — Other Ambulatory Visit: Payer: Self-pay | Admitting: Family Medicine

## 2013-04-11 ENCOUNTER — Other Ambulatory Visit: Payer: Self-pay | Admitting: Family Medicine

## 2013-05-04 ENCOUNTER — Other Ambulatory Visit: Payer: Self-pay | Admitting: Family Medicine

## 2013-06-06 ENCOUNTER — Other Ambulatory Visit: Payer: Self-pay | Admitting: Family Medicine

## 2013-06-07 ENCOUNTER — Telehealth: Payer: Self-pay | Admitting: *Deleted

## 2013-06-07 ENCOUNTER — Other Ambulatory Visit: Payer: Self-pay

## 2013-06-07 MED ORDER — OMEPRAZOLE 20 MG PO CPDR: DELAYED_RELEASE_CAPSULE | ORAL | Status: DC

## 2013-06-07 MED ORDER — ATORVASTATIN CALCIUM 80 MG PO TABS
80.0000 mg | ORAL_TABLET | Freq: Every day | ORAL | Status: DC
Start: 2013-06-07 — End: 2013-08-21

## 2013-06-07 NOTE — Telephone Encounter (Signed)
Tried to call pt but number is disconnected. Trying to inform pt that we would only be sending in 2 weeks worth of medication due to needing appt.  Oscar La, LPN

## 2013-06-22 ENCOUNTER — Other Ambulatory Visit: Payer: Self-pay | Admitting: Family Medicine

## 2013-08-08 ENCOUNTER — Encounter: Payer: Self-pay | Admitting: Family Medicine

## 2013-08-15 ENCOUNTER — Other Ambulatory Visit: Payer: Self-pay | Admitting: *Deleted

## 2013-08-15 DIAGNOSIS — Z0000 Encounter for general adult medical examination without abnormal findings: Secondary | ICD-10-CM

## 2013-08-21 ENCOUNTER — Encounter: Payer: Self-pay | Admitting: Family Medicine

## 2013-08-21 ENCOUNTER — Ambulatory Visit (INDEPENDENT_AMBULATORY_CARE_PROVIDER_SITE_OTHER): Payer: BC Managed Care – PPO | Admitting: Family Medicine

## 2013-08-21 VITALS — BP 134/75 | HR 65 | Ht 72.0 in | Wt 192.0 lb

## 2013-08-21 DIAGNOSIS — K769 Liver disease, unspecified: Secondary | ICD-10-CM

## 2013-08-21 DIAGNOSIS — Z0000 Encounter for general adult medical examination without abnormal findings: Secondary | ICD-10-CM | POA: Diagnosis not present

## 2013-08-21 DIAGNOSIS — R945 Abnormal results of liver function studies: Secondary | ICD-10-CM

## 2013-08-21 LAB — COMPLETE METABOLIC PANEL WITH GFR
ALK PHOS: 56 U/L (ref 39–117)
ALT: 41 U/L (ref 0–53)
AST: 43 U/L — AB (ref 0–37)
Albumin: 4.8 g/dL (ref 3.5–5.2)
BUN: 13 mg/dL (ref 6–23)
CALCIUM: 9.8 mg/dL (ref 8.4–10.5)
CO2: 25 mEq/L (ref 19–32)
Chloride: 104 mEq/L (ref 96–112)
Creat: 1 mg/dL (ref 0.50–1.35)
GFR, Est African American: >89 mL/min
GFR, Est Non African American: 80 mL/min
Glucose, Bld: 90 mg/dL (ref 70–99)
POTASSIUM: 4.3 meq/L (ref 3.5–5.3)
SODIUM: 141 meq/L (ref 135–145)
TOTAL PROTEIN: 7 g/dL (ref 6.0–8.3)
Total Bilirubin: 0.5 mg/dL (ref 0.2–1.2)

## 2013-08-21 LAB — LIPID PANEL
CHOL/HDL RATIO: 2.8 ratio
Cholesterol: 132 mg/dL (ref 0–200)
HDL: 48 mg/dL (ref >39)
LDL Cholesterol: 61 mg/dL (ref 0–99)
Triglycerides: 115 mg/dL (ref <150)
VLDL: 23 mg/dL (ref 0–40)

## 2013-08-21 LAB — PSA: PSA: 0.34 ng/mL (ref <=4.00)

## 2013-08-21 MED ORDER — SILDENAFIL CITRATE 50 MG PO TABS: 50.0000 mg | ORAL_TABLET | Freq: Every day | ORAL | Status: DC | PRN

## 2013-08-21 MED ORDER — OMEPRAZOLE 20 MG PO CPDR: DELAYED_RELEASE_CAPSULE | ORAL | Status: DC

## 2013-08-21 MED ORDER — ATORVASTATIN CALCIUM 80 MG PO TABS
80.0000 mg | ORAL_TABLET | Freq: Every day | ORAL | Status: DC
Start: 2013-08-21 — End: 2014-03-13

## 2013-08-21 NOTE — Addendum Note (Signed)
Addended by: Beatrice Lecher D on: 08/21/2013 03:32 PM   Modules accepted: Orders

## 2013-08-21 NOTE — Progress Notes (Addendum)
Subjective:    Patient ID: Bobby Lewis, male    DOB: 1950/08/16, 63 y.o.   MRN: [id number]  HPI Here for CPE.  No specific complaints or concerns today. He has been taking his Lipitor regularly without any side effects or problems and he does take a baby aspirin daily.   Review of Systems Comprehensive review of systems is negative today.  BP 134/75  Pulse 65  Ht 6' (1.829 m)  Wt 192 lb (87.091 kg)  BMI 26.03 kg/m2    No Known Allergies  Past Medical History  Diagnosis Date  . Genital warts     history  . Tobacco abuse   . COPD (chronic obstructive pulmonary disease)     Past Surgical History  Procedure Laterality Date  . Nose surgery  2002    History   Social History  . Marital Status: Married    Spouse Name: N/A    Number of Children: N/A  . Years of Education: N/A   Occupational History  . Not on file.   Social History Main Topics  . Smoking status: Former Smoker -- 1.00 packs/day for 25 years    Types: Cigarettes    Quit date: 02/01/2009  . Smokeless tobacco: Not on file  . Alcohol Use: 1.5 oz/week    3 drink(s) per week     Comment: daily  . Drug Use: No  . 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   Other Topics Concern  . Not on file   Social History Narrative  . No narrative on file    Family History  Problem Relation Age of Onset  . Joint hypermobility Mother   . Heart attack Father 45    died 47    Outpatient Encounter Prescriptions as of 08/21/2013  Medication Sig  . aspirin 81 MG tablet Take 81 mg by mouth daily.    Marland Kitchen atorvastatin (LIPITOR) 80 MG tablet Take 1 tablet (80 mg total) by mouth daily.  . imiquimod (ALDARA) 5 % cream Apply topically 2 (two) times a week.  . Multiple Vitamin (MULTIVITAMIN PO) Take by mouth.    . Omega-3 Fatty Acids (FISH OIL) 1000 MG CAPS Take 2,000 mg by mouth daily.  Marland Kitchen omeprazole  (PRILOSEC) 20 MG capsule TAKE ONE CAPSULE BY MOUTH EVERY DAY.  . [DISCONTINUED] atorvastatin (LIPITOR) 80 MG tablet Take 1 tablet (80 mg total) by mouth daily. MUST MAKE APPOINTMENT BEFORE ANY MORE REFILLS.  . [DISCONTINUED] omeprazole (PRILOSEC) 20 MG capsule TAKE ONE CAPSULE BY MOUTH EVERY DAY. MUST MAKE APPOINTMENT FOR ANY ADDITIONAL REFILLS.          Objective:   Physical Exam  Constitutional: He is oriented to person, place, and time. He appears well-developed and well-nourished.  HENT:  Head: Normocephalic and atraumatic.  Right Ear: External ear normal.  Left Ear: External ear normal.  Nose: Nose normal.  Mouth/Throat: Oropharynx is clear and moist.  Eyes: Conjunctivae and EOM are normal. Pupils are equal, round, and reactive to light.  Neck: Normal range of motion. Neck supple. No thyromegaly present.  Cardiovascular: Normal rate, regular rhythm, normal heart sounds and intact distal pulses.   Pulmonary/Chest: Effort normal and breath sounds normal.  Abdominal: Soft. Bowel sounds are normal. He exhibits no distension and no mass. There is no tenderness. There is no rebound and no guarding.  Musculoskeletal: Normal  range of motion.  Lymphadenopathy:    He has no cervical adenopathy.  Neurological: He is alert and oriented to person, place, and time. He has normal reflexes.  Skin: Skin is warm and dry.  Psychiatric: He has a normal mood and affect. His behavior is normal. Judgment and thought content normal.          Assessment & Plan:  CPE -  Keep up a regular exercise program and make sure you are eating a healthy diet Try to eat 4 servings of dairy a day, or if you are lactose intolerant take a calcium with vitamin D daily.  Your vaccines are up to date.    Hyperlipidemia - well controlled.  Labs done yesterday.  On ASA a day.    Abnormal liver enzymes-I. did go over his blood work with him today. One of his liver enzymes is mildly elevated. He does not take any  Tylenol products but does drink one or 2 beers most nights. Encouraged him to quit and recheck his levels in 3-4 weeks.  ED - he would like to try something for erectile dysfunction. He says it's really more of a desire issue he says he says not interested but would like to try something. Will give him a sample of Viagra 100 mg to try. If it's not working and please let me know. Coupon card provided and prescription for 3 tabs in to the pharmacy.

## 2013-08-21 NOTE — Patient Instructions (Signed)
Keep up a regular exercise program and make sure you are eating a healthy diet Try to eat 4 servings of dairy a day, or if you are lactose intolerant take a calcium with vitamin D daily.  Your vaccines are up to date.   

## 2013-08-22 ENCOUNTER — Other Ambulatory Visit: Payer: Self-pay | Admitting: *Deleted

## 2013-08-22 MED ORDER — TADALAFIL 5 MG PO TABS: 5.0000 mg | ORAL_TABLET | Freq: Every day | ORAL | Status: DC | PRN

## 2013-08-23 ENCOUNTER — Encounter: Payer: Self-pay | Admitting: Family Medicine

## 2013-09-18 LAB — HEPATITIS PANEL, ACUTE
HCV AB: NEGATIVE
HEP A IGM: NONREACTIVE
Hep B C IgM: NONREACTIVE
Hepatitis B Surface Ag: NEGATIVE

## 2013-09-18 LAB — HEPATIC FUNCTION PANEL
ALK PHOS: 65 U/L (ref 39–117)
ALT: 46 U/L (ref 0–53)
AST: 34 U/L (ref 0–37)
Albumin: 4.7 g/dL (ref 3.5–5.2)
BILIRUBIN INDIRECT: 0.4 mg/dL (ref 0.2–1.2)
Bilirubin, Direct: 0.1 mg/dL (ref 0.0–0.3)
Total Bilirubin: 0.5 mg/dL (ref 0.2–1.2)
Total Protein: 7.1 g/dL (ref 6.0–8.3)

## 2013-09-19 ENCOUNTER — Encounter: Payer: Self-pay | Admitting: *Deleted

## 2013-11-01 ENCOUNTER — Ambulatory Visit (INDEPENDENT_AMBULATORY_CARE_PROVIDER_SITE_OTHER): Payer: BC Managed Care – PPO | Admitting: Family Medicine

## 2013-11-01 VITALS — Temp 97.8°F

## 2013-11-01 DIAGNOSIS — Z23 Encounter for immunization: Secondary | ICD-10-CM | POA: Diagnosis not present

## 2013-11-01 NOTE — Progress Notes (Signed)
   Subjective:    Patient ID: Bobby Lewis, male    DOB: Dec 30, 1950, 63 y.o.   MRN: [id number]  HPI Reports today for flu vaccine which he rec'd without complication. Denies fever, cough or cold symptoms recently. Margette Fast, CMA    Review of Systems     Objective:   Physical Exam        Assessment & Plan:

## 2013-11-08 ENCOUNTER — Ambulatory Visit: Payer: BC Managed Care – PPO

## 2014-03-13 ENCOUNTER — Other Ambulatory Visit: Payer: Self-pay | Admitting: Family Medicine

## 2014-04-10 ENCOUNTER — Encounter: Payer: Self-pay | Admitting: Family Medicine

## 2014-08-19 ENCOUNTER — Telehealth: Payer: Self-pay | Admitting: Family Medicine

## 2014-08-19 NOTE — Telephone Encounter (Signed)
Patient called, would like to have lab work completed prior to her upcoming appt so the results can be discussed in person.

## 2014-08-28 ENCOUNTER — Other Ambulatory Visit: Payer: Self-pay | Admitting: Family Medicine

## 2014-09-03 ENCOUNTER — Encounter: Payer: Self-pay | Admitting: Family Medicine

## 2014-09-03 DIAGNOSIS — Z114 Encounter for screening for human immunodeficiency virus [HIV]: Secondary | ICD-10-CM

## 2014-09-03 DIAGNOSIS — Z125 Encounter for screening for malignant neoplasm of prostate: Secondary | ICD-10-CM

## 2014-09-03 DIAGNOSIS — E785 Hyperlipidemia, unspecified: Secondary | ICD-10-CM

## 2014-09-03 DIAGNOSIS — Z0000 Encounter for general adult medical examination without abnormal findings: Secondary | ICD-10-CM

## 2014-09-06 NOTE — Telephone Encounter (Signed)
Orders placed and faxed. You can have these done at your convenience  .Audelia Hives [place]

## 2014-09-12 LAB — COMPLETE METABOLIC PANEL WITH GFR
ALBUMIN: 4.8 g/dL (ref 3.6–5.1)
ALT: 55 U/L — ABNORMAL HIGH (ref 9–46)
AST: 48 U/L — ABNORMAL HIGH (ref 10–35)
Alkaline Phosphatase: 55 U/L (ref 40–115)
BILIRUBIN TOTAL: 0.6 mg/dL (ref 0.2–1.2)
BUN: 20 mg/dL (ref 7–25)
CHLORIDE: 108 mmol/L (ref 98–110)
CO2: 27 mmol/L (ref 20–31)
Calcium: 9.5 mg/dL (ref 8.6–10.3)
Creat: 1.14 mg/dL (ref 0.70–1.25)
GFR, Est African American: 79 mL/min (ref >=60)
GFR, Est Non African American: 68 mL/min (ref >=60)
GLUCOSE: 92 mg/dL (ref 65–99)
Potassium: 4.2 mmol/L (ref 3.5–5.3)
Sodium: 144 mmol/L (ref 135–146)
Total Protein: 7.1 g/dL (ref 6.1–8.1)

## 2014-09-12 LAB — LIPID PANEL
Cholesterol: 134 mg/dL (ref 125–200)
HDL: 54 mg/dL (ref >=40)
LDL Cholesterol: 66 mg/dL (ref <130)
Total CHOL/HDL Ratio: 2.5 Ratio (ref <=5.0)
Triglycerides: 72 mg/dL (ref <150)
VLDL: 14 mg/dL (ref <30)

## 2014-09-13 LAB — PSA: PSA: 0.35 ng/mL (ref <=4.00)

## 2014-09-13 LAB — HIV ANTIBODY (ROUTINE TESTING W REFLEX): HIV: NONREACTIVE

## 2014-09-16 ENCOUNTER — Other Ambulatory Visit: Payer: Self-pay | Admitting: *Deleted

## 2014-09-16 DIAGNOSIS — R748 Abnormal levels of other serum enzymes: Secondary | ICD-10-CM

## 2014-09-17 ENCOUNTER — Encounter: Payer: Self-pay | Admitting: Family Medicine

## 2014-09-17 ENCOUNTER — Ambulatory Visit (INDEPENDENT_AMBULATORY_CARE_PROVIDER_SITE_OTHER): Payer: BLUE CROSS/BLUE SHIELD | Admitting: Family Medicine

## 2014-09-17 VITALS — BP 136/80 | HR 63 | Ht 72.0 in | Wt 185.0 lb

## 2014-09-17 DIAGNOSIS — Z0000 Encounter for general adult medical examination without abnormal findings: Secondary | ICD-10-CM | POA: Diagnosis not present

## 2014-09-17 DIAGNOSIS — Z23 Encounter for immunization: Secondary | ICD-10-CM | POA: Diagnosis not present

## 2014-09-17 DIAGNOSIS — N529 Male erectile dysfunction, unspecified: Secondary | ICD-10-CM

## 2014-09-17 DIAGNOSIS — E785 Hyperlipidemia, unspecified: Secondary | ICD-10-CM

## 2014-09-17 MED ORDER — TADALAFIL 20 MG PO TABS: 20.0000 mg | ORAL_TABLET | Freq: Every day | ORAL | Status: DC | PRN

## 2014-09-17 NOTE — Addendum Note (Signed)
Addended by: Beatrice Lecher D on: 09/17/2014 05:11 PM   Modules accepted: Orders

## 2014-09-17 NOTE — Progress Notes (Addendum)
Subjective:    Patient ID: Bobby Lewis, male    DOB: [birth year], 64 y.o.   MRN: [id number]  HPI Here for CPE - he does have one concern about his custom medication. His liver enzymes are mildly elevated and he wanted to know if the atorvastatin could be causing this. He says since his numbers were so good he wants if he might be able to split the tablet in half. He has no prior history of coronary artery disease. He is not diabetic. He is active. He has lost 7 lbs.  Has only had about 2 beers in the last 6 months. Doesn't use tylenol product often.    Review of Systems   BP 144/83 mmHg  Pulse 63  Ht 6' (1.829 m)  Wt 185 lb (83.915 kg)  BMI 25.08 kg/m2    No Known Allergies  Past Medical History  Diagnosis Date  . Genital warts     history  . Tobacco abuse   . COPD (chronic obstructive pulmonary disease)     Past Surgical History  Procedure Laterality Date  . Nose surgery  2002    Social History   Social History  . Marital Status: Married    Spouse Name: N/A  . Number of Children: N/A  . Years of Education: N/A   Occupational History  . Not on file.   Social History Main Topics  . Smoking status: Former Smoker -- 1.00 packs/day for 25 years    Types: Cigarettes    Quit date: 02/01/2009  . Smokeless tobacco: Not on file  . Alcohol Use: 1.5 oz/week    3 drink(s) per week     Comment: daily  . Drug Use: No  . 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   Other Topics Concern  . Not on file   Social History Narrative    Family History  Problem Relation Age of Onset  . Joint hypermobility Mother   . Heart attack Father 35    died 87    Outpatient Encounter Prescriptions as of 09/17/2014  Medication Sig  . aspirin 81 MG tablet Take 81 mg by mouth daily.    Marland Kitchen atorvastatin (LIPITOR) 80 MG tablet TAKE 1 TABLET (80 MG TOTAL) BY MOUTH DAILY.  (Patient taking differently: TAKE 1/2 TABLET (40 MG TOTAL) BY MOUTH DAILY.)  . imiquimod (ALDARA) 5 % cream Apply topically 2 (two) times a week.  . Multiple Vitamin (MULTIVITAMIN PO) Take by mouth.    . Omega-3 Fatty Acids (FISH OIL) 1000 MG CAPS Take 2,000 mg by mouth daily.  Marland Kitchen omeprazole (PRILOSEC) 20 MG capsule TAKE ONE CAPSULE BY MOUTH EVERY DAY.  . [DISCONTINUED] sildenafil (VIAGRA) 50 MG tablet Take 1 tablet (50 mg total) by mouth daily as needed for erectile dysfunction.  . [DISCONTINUED] tadalafil (CIALIS) 5 MG tablet Take 1 tablet (5 mg total) by mouth daily as needed for erectile dysfunction.   No facility-administered encounter medications on file as of 09/17/2014.          Objective:   Physical Exam  Constitutional: He is oriented to person, place, and time. He appears well-developed and well-nourished.  HENT:  Head: Normocephalic and atraumatic.  Right Ear: External ear normal.  Left Ear: External ear normal.  Nose: Nose normal.  Mouth/Throat: Oropharynx is clear and moist.  Eyes: Conjunctivae and EOM are  normal. Pupils are equal, round, and reactive to light.  Neck: Normal range of motion. Neck supple. No thyromegaly present.  Cardiovascular: Normal rate, regular rhythm, normal heart sounds and intact distal pulses.   Pulmonary/Chest: Effort normal and breath sounds normal.  Abdominal: Soft. Bowel sounds are normal. He exhibits no distension and no mass. There is no tenderness. There is no rebound and no guarding.  Musculoskeletal: Normal range of motion.  Lymphadenopathy:    He has no cervical adenopathy.  Neurological: He is alert and oriented to person, place, and time. He has normal reflexes.  Skin: Skin is warm and dry.  Psychiatric: He has a normal mood and affect. His behavior is normal. Judgment and thought content normal.          Assessment & Plan:  CPE Keep up a regular exercise program and make sure you are eating a healthy diet Try to eat 4  servings of dairy a day, or if you are lactose intolerant take a calcium with vitamin D daily.  Your vaccines are up to date.  congrutulated him on ewight loss.   Hyperlpidemia- ok to cut the lipitor in half. Recheck lipids and liver in 4 months.    Erectile dysfunction-he did try the Viagra. He took 100 mg and only noticed some minimal results. He also try the daily Cialis for 30 days at 5 mg dose and did not feel like it was effective. He would like to try something else. We'll try the higher dose Cialis of 20 mg. Coupon card provided severe he can get 3 tabs of for free at his local pharmacy.

## 2014-09-17 NOTE — Patient Instructions (Signed)
Dupuytren's Contracture Dupuytren's contracture affects the fingers and the palm of the hand. This condition usually develops slowly. It may take many years to develop. The pinky finger and the ring finger are most often affected. These fingers start to curve inward, like a claw. At some point, the fingers cannot go straight anymore. This can make it hard to do things like:  Put on gloves.  Shake hands.  Grab something off a shelf. The condition usually does not cause pain and is not dangerous. The condition gets its name from the doctor who came up with an operation to fix the problem. His name was Baron Guillaume Dupuytren. Contracture means pulling inward. CAUSES  Dupuytren's contracture does not start with the fingers. It starts in the palm of the hand, under the skin. The tissue under the skin is called fascia. The fascia covers the cords (tendons) that control how the fingers move. In Dupuytren's contracture the fascia tissue becomes thick and then pulls on the cords. That causes the fingers to curl. The condition can affect both hands and any fingers, but it usually strikes one hand worse than the other. The fingers farthest from the thumb are most often the ones that curl. The cause is not clear. Some experts believe it results from an autoimmune reaction. That means the body's immune system (which fights off disease) attacks itself by mistake. What experts do know is that certain conditions and behaviors (called risk factors) make the chance of having this condition more likely. They include:  Age. Most people who have the condition are older than 50.  Sex. It affects men more often than women.  Family history. The condition tends to run in families from countries in [place].  Certain behaviors. People who smoke and drink alcohol are more apt to develop the problem.  Some other medical conditions. Having diabetes makes Dupuytren's contracture more likely. So does  having a condition that involves a seizure (when the brain's function is interrupted). SYMPTOMS  Signs of this condition take time to develop. Sometimes this takes weeks or months. More often, it takes several years.   Early symptoms:  Skin on the palm of the hand becomes thick. This is usually the first sign.  The skin may look dimpled or puckered.  Lumps (nodules) show up on the palm. There may be one or more lumps. They are not painful.  Later symptoms:  Thick cords of tissue form in the palm of the hand.  The pinky and ring fingers start to curl up into the palm.  The fingers cannot be straightened into their normal position. DIAGNOSIS  A physical examination is the main way that a healthcare provider can tell if you have Dupuytren's contracture. Other tests usually are not needed. The caregiver will probably:  Look at your hands. Feel your hands. This is to check for thickening and nodules.  Measure finger motion. This tells how much your fingers have contracted (pulled in).  Do a tabletop test. You will be asked to try to put your hand flat on a table, palm down. TREATMENT  There is no cure for Dupuytren's contracture. But there are ways to treat the symptoms. Options include:  Watching and waiting. The condition develops slowly. Often it does not create problems for a long time. Sometimes the skin gets thick and nodules form, but the fingers never curl. So, in some cases it is best to just watch the condition carefully and wait to see what happens.    Shots (injections). Different substances may be injected, including:  Steroids. These drugs block swelling. These shots should make the condition less uncomfortable. Steroids may also slow down the condition. Shots are given into the nodules. The effect only lasts awhile. More shots may have to be given.  Enzymes. These are proteins. They weaken the thick tissue. After an injection, the caregiver usually stretches the  fingers.  Needling. A needle is pushed through the skin and into the thick tissue. This is done in several spots. The goal is to break up the thickened tissue. Or to weaken it.  Surgery. This may be suggested if you cannot grasp objects. Or, if you can no longer put your hand in your pocket.  A cut (incision) is made in the palm of the hand. The thick tissue is removed.  Sometimes the thick tissue is attached to the skin. Then, the skin must be removed, too. It is replaced with a piece of skin from another place on your body. That is called a skin graft.  Occupational or hand therapy is almost always needed after surgery. This involves special exercises to get back the use of your hand and fingers. After a skin graft, several months of therapy may be needed.  Sometimes the condition comes back, even after surgery.  Other methods. You can do some things on your own. They include:  Stretching the fingers backwards. Do this often.  Warming the hand and massaging it. Again, do this often.  Using tools with padded grips. This should make things easier.  Wearing heavy gloves while working. This protects the hands. PROGNOSIS  Dupuytren's contracture usually develops slowly. There is no cure. But, the symptoms can be treated. Sometimes they come back after treatment, but not always. It is important to remember that this is a functional problem and not a life-threatening condition. Document Released: 11/15/2008 Document Revised: 04/12/2011 Document Reviewed: 11/15/2008 [employer] Patient Information 2015 [employer]. This information is not intended to replace advice given to you by your health care provider. Make sure you discuss any questions you have with your health care provider.  

## 2014-09-18 ENCOUNTER — Encounter: Payer: Self-pay | Admitting: Family Medicine

## 2014-09-18 ENCOUNTER — Ambulatory Visit (INDEPENDENT_AMBULATORY_CARE_PROVIDER_SITE_OTHER): Payer: BLUE CROSS/BLUE SHIELD | Admitting: Family Medicine

## 2014-09-18 VITALS — BP 127/76 | HR 64 | Wt 182.0 lb

## 2014-09-18 DIAGNOSIS — M720 Palmar fascial fibromatosis [Dupuytren]: Secondary | ICD-10-CM | POA: Diagnosis not present

## 2014-09-18 MED ORDER — DICLOFENAC SODIUM 1 % TD GEL: 2.0000 g | Freq: Four times a day (QID) | TRANSDERMAL | Status: DC

## 2014-09-18 NOTE — Progress Notes (Signed)
   Subjective:    I'm seeing this patient as a consultation for:  Dr. Madilyn Fireman  CC: Right hands Dupuytren's contracture  HPI: Patient notes a several year history of bilateral Dupuytren's contractures. He notes the right hand is becoming more painful now. Specifically he notes the fourth MCP of the right hand is painful when he plays golf. He notes a jarring pain with the first few swings. Otherwise his pain is very minimal to nonexistent. He's tried some over-the-counter pain medicines intermittently which helped a little. The pain is been worse for the past few months. He does exercises intermittently to extend his fingers but denies any functional limitation of motion of his hand due to contractures.  Past medical history, Surgical history, Family history not pertinant except as noted below, Social history, Allergies, and medications have been entered into the medical record, reviewed, and no changes needed.   Review of Systems: No headache, visual changes, nausea, vomiting, diarrhea, constipation, dizziness, abdominal pain, skin rash, fevers, chills, night sweats, weight loss, swollen lymph nodes, body aches, joint swelling, muscle aches, chest pain, shortness of breath, mood changes, visual or auditory hallucinations.   Objective:    Filed Vitals:   09/18/14 1309  BP: 127/76  Pulse: 64   General: Well Developed, well nourished, and in no acute distress.  Neuro/Psych: Alert and oriented x3, extra-ocular muscles intact, able to move all 4 extremities, sensation grossly intact. Skin: Warm and dry, no rashes noted.  Respiratory: Not using accessory muscles, speaking in full sentences, trachea midline.  Cardiovascular: Pulses palpable, no extremity edema. Abdomen: Does not appear distended. MSK: Bilateral hands: Mild Dupuytren's contracture of the fourth and fifth ulnar hand. It is not tender. He is intact extension. Normal grip strength pulses capillary refill.  No results found for this  or any previous visit (from the past 24 hour(s)). No results found.  Impression and Recommendations:   This case required medical decision making of moderate complexity.

## 2014-09-18 NOTE — Assessment & Plan Note (Signed)
Symptomatic on the right with golf. I believe this is either microtears of the contracture with a golf swing or just the impact of the club onto his relatively exposed metacarpal head. Plan to treat with hand physical therapy, and padding his glove, and diclofenac gel. Return in a few weeks if not better.

## 2014-09-18 NOTE — Patient Instructions (Signed)
Thank you for coming in today. Work with PT.  Try a glove on your right hand.  Use voltaren gel.  Return in 1 month.   Dupuytren's Contracture Dupuytren's contracture affects the fingers and the palm of the hand. This condition usually develops slowly. It may take many years to develop. The pinky finger and the ring finger are most often affected. These fingers start to curve inward, like a claw. At some point, the fingers cannot go straight anymore. This can make it hard to do things like:  Put on gloves.  Shake hands.  Grab something off a shelf. The condition usually does not cause pain and is not dangerous. The condition gets its name from the doctor who came up with an operation to fix the problem. His name was Lanney Gins Dupuytren. Contracture means pulling inward. CAUSES  Dupuytren's contracture does not start with the fingers. It starts in the palm of the hand, under the skin. The tissue under the skin is called fascia. The fascia covers the cords (tendons) that control how the fingers move. In Dupuytren's contracture the fascia tissue becomes thick and then pulls on the cords. That causes the fingers to curl. The condition can affect both hands and any fingers, but it usually strikes one hand worse than the other. The fingers farthest from the thumb are most often the ones that curl. The cause is not clear. Some experts believe it results from an autoimmune reaction. That means the body's immune system (which fights off disease) attacks itself by mistake. What experts do know is that certain conditions and behaviors (called risk factors) make the chance of having this condition more likely. They include:  Age. Most people who have the condition are older than 50.  Sex. It affects men more often than women.  Family history. The condition tends to run in families from countries in [place] and [place].  Certain behaviors. People who smoke and drink alcohol are more apt  to develop the problem.  Some other medical conditions. Having diabetes makes Dupuytren's contracture more likely. So does having a condition that involves a seizure (when the brain's function is interrupted). SYMPTOMS  Signs of this condition take time to develop. Sometimes this takes weeks or months. More often, it takes several years.   Early symptoms:  Skin on the palm of the hand becomes thick. This is usually the first sign.  The skin may look dimpled or puckered.  Lumps (nodules) show up on the palm. There may be one or more lumps. They are not painful.  Later symptoms:  Thick cords of tissue form in the palm of the hand.  The pinky and ring fingers start to curl up into the palm.  The fingers cannot be straightened into their normal position. DIAGNOSIS  A physical examination is the main way that a healthcare provider can tell if you have Dupuytren's contracture. Other tests usually are not needed. The caregiver will probably:  Look at your hands. Feel your hands. This is to check for thickening and nodules.  Measure finger motion. This tells how much your fingers have contracted (pulled in).  Do a tabletop test. You will be asked to try to put your hand flat on a table, palm down. TREATMENT  There is no cure for Dupuytren's contracture. But there are ways to treat the symptoms. Options include:  Watching and waiting. The condition develops slowly. Often it does not create problems for a long time. Sometimes the skin gets thick  and nodules form, but the fingers never curl. So, in some cases it is best to just watch the condition carefully and wait to see what happens.  Shots (injections). Different substances may be injected, including:  Steroids. These drugs block swelling. These shots should make the condition less uncomfortable. Steroids may also slow down the condition. Shots are given into the nodules. The effect only lasts awhile. More shots may have to be  given.  Enzymes. These are proteins. They weaken the thick tissue. After an injection, the caregiver usually stretches the fingers.  Needling. A needle is pushed through the skin and into the thick tissue. This is done in several spots. The goal is to break up the thickened tissue. Or to weaken it.  Surgery. This may be suggested if you cannot grasp objects. Or, if you can no longer put your hand in your pocket.  A cut (incision) is made in the palm of the hand. The thick tissue is removed.  Sometimes the thick tissue is attached to the skin. Then, the skin must be removed, too. It is replaced with a piece of skin from another place on your body. That is called a skin graft.  Occupational or hand therapy is almost always needed after surgery. This involves special exercises to get back the use of your hand and fingers. After a skin graft, several months of therapy may be needed.  Sometimes the condition comes back, even after surgery.  Other methods. You can do some things on your own. They include:  Stretching the fingers backwards. Do this often.  Warming the hand and massaging it. Again, do this often.  Using tools with padded grips. This should make things easier.  Wearing heavy gloves while working. This protects the hands. PROGNOSIS  Dupuytren's contracture usually develops slowly. There is no cure. But, the symptoms can be treated. Sometimes they come back after treatment, but not always. It is important to remember that this is a functional problem and not a life-threatening condition. Document Released: 11/15/2008 Document Revised: 04/12/2011 Document Reviewed: 11/15/2008 [facility] Patient Information 2015 [place], [place]. This information is not intended to replace advice given to you by your health care provider. Make sure you discuss any questions you have with your health care provider.

## 2014-11-06 ENCOUNTER — Encounter: Payer: Self-pay | Admitting: Family Medicine

## 2014-11-22 ENCOUNTER — Telehealth: Payer: Self-pay | Admitting: Family Medicine

## 2014-11-22 NOTE — Telephone Encounter (Signed)
RE: Visit Follow-Up Question    From  Hali Marry, MD   To  Bobby Lewis     Sent  11/07/2014 9:36 AM     We can recheck lipids and liver in 4 months. You can call the day before you want to go and we can fax the labslip downstairs.   Dr. Madilyn Fireman      Previous Messages     ----- Message -----   From: Bobby Lewis   Sent: 11/06/2014 4:58 PM EDT    To: Beatrice Lecher, MD  Subject: Visit Follow-Up Question   You cut my atorvastatin in half during my last visit and said we would retest my blood for my liver. My follow up plan doesn't reflect when I should have my blood tested. I was hoping to get off the atorvastatin if possible.   Thanks  Bobby Lewis      [address]     MyChart User Last Read On  Bobby Lewis Not Read

## 2014-11-22 NOTE — Telephone Encounter (Signed)
Left detailed message on patient vm with instructions as noted below.Rhonda Cunningham,CMA

## 2014-11-26 ENCOUNTER — Other Ambulatory Visit: Payer: Self-pay | Admitting: Family Medicine

## 2014-12-14 ENCOUNTER — Other Ambulatory Visit: Payer: Self-pay | Admitting: Family Medicine

## 2014-12-16 ENCOUNTER — Other Ambulatory Visit: Payer: Self-pay | Admitting: Family Medicine

## 2014-12-17 MED ORDER — OMEPRAZOLE 20 MG PO CPDR: 20.0000 mg | DELAYED_RELEASE_CAPSULE | Freq: Every day | ORAL | Status: DC

## 2014-12-17 NOTE — Addendum Note (Signed)
Addended by: Teddy Spike on: 12/17/2014 06:32 PM   Modules accepted: Orders

## 2015-02-04 ENCOUNTER — Encounter: Payer: Self-pay | Admitting: Family Medicine

## 2015-02-04 DIAGNOSIS — E785 Hyperlipidemia, unspecified: Secondary | ICD-10-CM

## 2015-02-04 DIAGNOSIS — Z13220 Encounter for screening for lipoid disorders: Secondary | ICD-10-CM

## 2015-02-18 ENCOUNTER — Other Ambulatory Visit: Payer: Self-pay | Admitting: Family Medicine

## 2015-02-19 ENCOUNTER — Encounter: Payer: Self-pay | Admitting: Family Medicine

## 2015-02-19 LAB — HEPATIC FUNCTION PANEL
ALBUMIN: 4.8 g/dL (ref 3.6–5.1)
ALT: 104 U/L — AB (ref 9–46)
AST: 74 U/L — AB (ref 10–35)
Alkaline Phosphatase: 64 U/L (ref 40–115)
Bilirubin, Direct: 0.1 mg/dL (ref <=0.2)
Indirect Bilirubin: 0.4 mg/dL (ref 0.2–1.2)
TOTAL PROTEIN: 7.3 g/dL (ref 6.1–8.1)
Total Bilirubin: 0.5 mg/dL (ref 0.2–1.2)

## 2015-02-19 LAB — LIPID PANEL
CHOL/HDL RATIO: 2.8 ratio (ref <=5.0)
CHOLESTEROL: 153 mg/dL (ref 125–200)
HDL: 55 mg/dL (ref >=40)
LDL Cholesterol: 80 mg/dL (ref <130)
TRIGLYCERIDES: 90 mg/dL (ref <150)
VLDL: 18 mg/dL (ref <30)

## 2015-02-20 ENCOUNTER — Other Ambulatory Visit: Payer: Self-pay | Admitting: Family Medicine

## 2015-02-20 ENCOUNTER — Encounter: Payer: Self-pay | Admitting: Family Medicine

## 2015-02-20 DIAGNOSIS — R748 Abnormal levels of other serum enzymes: Secondary | ICD-10-CM

## 2015-02-20 MED ORDER — ATORVASTATIN CALCIUM 40 MG PO TABS: 40.0000 mg | ORAL_TABLET | Freq: Every day | ORAL | Status: DC

## 2015-02-27 ENCOUNTER — Ambulatory Visit (INDEPENDENT_AMBULATORY_CARE_PROVIDER_SITE_OTHER): Payer: BLUE CROSS/BLUE SHIELD

## 2015-02-27 DIAGNOSIS — R748 Abnormal levels of other serum enzymes: Secondary | ICD-10-CM

## 2015-08-29 ENCOUNTER — Telehealth: Payer: Self-pay

## 2015-08-29 DIAGNOSIS — Z131 Encounter for screening for diabetes mellitus: Secondary | ICD-10-CM

## 2015-08-29 DIAGNOSIS — Z125 Encounter for screening for malignant neoplasm of prostate: Secondary | ICD-10-CM

## 2015-08-29 DIAGNOSIS — R945 Abnormal results of liver function studies: Secondary | ICD-10-CM

## 2015-08-29 DIAGNOSIS — E785 Hyperlipidemia, unspecified: Secondary | ICD-10-CM

## 2015-08-29 DIAGNOSIS — R7989 Other specified abnormal findings of blood chemistry: Secondary | ICD-10-CM

## 2015-08-29 NOTE — Telephone Encounter (Signed)
CMP, lipid, PSA, A1C

## 2015-08-29 NOTE — Telephone Encounter (Signed)
Bobby Lewis is coming in for an Annual exam in the next week or so. What labs should patient have before his appointment?

## 2015-09-01 NOTE — Telephone Encounter (Signed)
Patient aware to go to lab. Orders sent.

## 2015-09-10 LAB — LIPID PANEL
CHOL/HDL RATIO: 2.3 ratio (ref <=5.0)
Cholesterol: 142 mg/dL (ref 125–200)
HDL: 61 mg/dL (ref >=40)
LDL CALC: 59 mg/dL (ref <130)
TRIGLYCERIDES: 108 mg/dL (ref <150)
VLDL: 22 mg/dL (ref <30)

## 2015-09-10 LAB — COMPLETE METABOLIC PANEL WITH GFR
ALT: 92 U/L — AB (ref 9–46)
AST: 80 U/L — ABNORMAL HIGH (ref 10–35)
Albumin: 4.8 g/dL (ref 3.6–5.1)
Alkaline Phosphatase: 56 U/L (ref 40–115)
BUN: 14 mg/dL (ref 7–25)
CHLORIDE: 103 mmol/L (ref 98–110)
CO2: 25 mmol/L (ref 20–31)
CREATININE: 0.99 mg/dL (ref 0.70–1.25)
Calcium: 9.6 mg/dL (ref 8.6–10.3)
GFR, EST NON AFRICAN AMERICAN: 80 mL/min (ref >=60)
Glucose, Bld: 96 mg/dL (ref 65–99)
Potassium: 4.2 mmol/L (ref 3.5–5.3)
SODIUM: 141 mmol/L (ref 135–146)
TOTAL PROTEIN: 6.9 g/dL (ref 6.1–8.1)
Total Bilirubin: 0.4 mg/dL (ref 0.2–1.2)

## 2015-09-10 LAB — HEMOGLOBIN A1C
Hgb A1c MFr Bld: 5.7 % — ABNORMAL HIGH (ref <5.7)
Mean Plasma Glucose: 117 mg/dL

## 2015-09-10 LAB — PSA: PSA: 0.3 ng/mL (ref <=4.0)

## 2015-09-10 NOTE — Addendum Note (Signed)
Addended by: Teddy Spike on: 09/10/2015 08:02 AM   Modules accepted: Orders

## 2015-09-18 ENCOUNTER — Encounter: Payer: Self-pay | Admitting: Family Medicine

## 2015-09-18 ENCOUNTER — Ambulatory Visit (INDEPENDENT_AMBULATORY_CARE_PROVIDER_SITE_OTHER): Payer: BLUE CROSS/BLUE SHIELD | Admitting: Family Medicine

## 2015-09-18 VITALS — BP 130/82 | HR 50 | Resp 15 | Ht 72.0 in | Wt 166.0 lb

## 2015-09-18 DIAGNOSIS — R748 Abnormal levels of other serum enzymes: Secondary | ICD-10-CM

## 2015-09-18 DIAGNOSIS — Z0000 Encounter for general adult medical examination without abnormal findings: Secondary | ICD-10-CM

## 2015-09-18 DIAGNOSIS — R7301 Impaired fasting glucose: Secondary | ICD-10-CM | POA: Diagnosis not present

## 2015-09-18 DIAGNOSIS — N529 Male erectile dysfunction, unspecified: Secondary | ICD-10-CM

## 2015-09-18 DIAGNOSIS — Z23 Encounter for immunization: Secondary | ICD-10-CM | POA: Diagnosis not present

## 2015-09-18 DIAGNOSIS — Z0189 Encounter for other specified special examinations: Secondary | ICD-10-CM

## 2015-09-18 MED ORDER — TADALAFIL 20 MG PO TABS: 20.0000 mg | ORAL_TABLET | Freq: Every day | ORAL | 5 refills | Status: DC | PRN

## 2015-09-18 NOTE — Patient Instructions (Addendum)
Cut your atorvastatin in half. Take half a tab nightly.

## 2015-09-18 NOTE — Progress Notes (Signed)
Subjective:    Patient ID: Bobby Lewis, male    DOB: 1950/10/09, 65 y.o.   MRN: [id number]  HPI Here for CPE.    65 yo male With a history of hyperlipidemia and colon polyps who comes in today for a complete physical.. He went to the lab for his blood work.  Does have some questions and concerns. His hemoglobin A1c was elevated in the prediabetic range. He actually had some blood work done back in the spring for a health screening at that point his A1c was actually 6.1. He says since then he is really trying to work on diet and exercise and has actually lost 10 pounds.  Also his liver enzymes were mildly elevated on and off over the last year or 2 but have been trending upward.  Review of Systems Hypertensive review of systems is negative. Overall he says he actually feels great.  BP (!) 145/69 (BP Location: Left Arm, Patient Position: Sitting, Cuff Size: Normal)   Pulse (!) 50   Resp 15   Ht 6' (1.829 m)   Wt 166 lb (75.3 kg)   SpO2 98%   BMI 22.51 kg/m     No Known Allergies  Past Medical History:  Diagnosis Date  . COPD (chronic obstructive pulmonary disease) ([place])   . Genital warts    history  . Tobacco abuse     Past Surgical History:  Procedure Laterality Date  . NOSE SURGERY  2002    Social History   Social History  . Marital status: Married    Spouse name: N/A  . Number of children: N/A  . Years of education: N/A   Occupational History  . Not on file.   Social History Main Topics  . Smoking status: Former Smoker    Packs/day: 1.00    Years: 25.00    Types: Cigarettes    Quit date: 02/01/2009  . Smokeless tobacco: Not on file  . Alcohol use 1.5 oz/week    3 drink(s) per week     Comment: daily  . Drug use: No  . 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   Other Topics Concern  . Not on file   Social History Narrative  . No  narrative on file    Family History  Problem Relation Age of Onset  . Joint hypermobility Mother   . Heart attack Father 80    died 44    Outpatient Encounter Prescriptions as of 09/18/2015  Medication Sig  . aspirin 81 MG tablet Take 81 mg by mouth daily.    Marland Kitchen atorvastatin (LIPITOR) 40 MG tablet Take 1 tablet (40 mg total) by mouth at bedtime.  . diclofenac sodium (VOLTAREN) 1 % GEL Apply 2 g topically 4 (four) times daily. To affected joint.  . imiquimod (ALDARA) 5 % cream Apply topically 2 (two) times a week.  . Multiple Vitamin (MULTIVITAMIN PO) Take by mouth.    . Omega-3 Fatty Acids (FISH OIL) 1000 MG CAPS Take 2,000 mg by mouth daily.  Marland Kitchen omeprazole (PRILOSEC) 20 MG capsule Take 1 capsule (20 mg total) by mouth daily.  . tadalafil (CIALIS) 20 MG tablet Take 1 tablet (20 mg total) by mouth daily as needed for erectile dysfunction.   No facility-administered encounter medications on file as of 09/18/2015.        Objective:  Physical Exam  Constitutional: He is oriented to person, place, and time. He appears well-developed and well-nourished.  HENT:  Head: Normocephalic and atraumatic.  Right Ear: External ear normal.  Left Ear: External ear normal.  Nose: Nose normal.  Mouth/Throat: Oropharynx is clear and moist.  Eyes: Conjunctivae and EOM are normal. Pupils are equal, round, and reactive to light.  Neck: Normal range of motion. Neck supple. No thyromegaly present.  Cardiovascular: Normal rate, regular rhythm, normal heart sounds and intact distal pulses.   Pulmonary/Chest: Effort normal and breath sounds normal.  Abdominal: Soft. Bowel sounds are normal. He exhibits no distension and no mass. There is no tenderness. There is no rebound and no guarding.  Musculoskeletal: Normal range of motion.  Lymphadenopathy:    He has no cervical adenopathy.  Neurological: He is alert and oriented to person, place, and time. He has normal reflexes.  Skin: Skin is warm and dry.   Psychiatric: He has a normal mood and affect. His behavior is normal. Judgment and thought content normal.       Assessment & Plan:  CPE Keep up a regular exercise program and make sure you are eating a healthy diet Try to eat 4 servings of dairy a day, or if you are lactose intolerant take a calcium with vitamin D daily.  Your vaccines are up to date.   IFG - recent A1c was 5.7 in the prediabetes range. Discussed working on avoiding sweets, reducing carbohydrate intake and getting regular exercise. Plan to recheck in 6 months. Lab Results  Component Value Date   HGBA1C 5.7 (H) 09/09/2015   Elevated liver enzymes - Explained that I would really like to refer him for further evaluation. I'm concerned that his liver enzymes are trending upward over the last year when he in fact has actually been eating healthier and has actually lost weight. Certainly it could be from his statin that he's been on for quite some time so I personally think that that's unlikely. Though, his last cluster all results were absolutely fantastic; ahead and decrease his atorvastatin down to 20 mg. refer to GI.

## 2015-09-29 ENCOUNTER — Telehealth: Payer: Self-pay | Admitting: *Deleted

## 2015-09-29 NOTE — Telephone Encounter (Signed)
Cialis 20 mg PA initiated in covermymeds Key: [id number]

## 2015-10-01 NOTE — Telephone Encounter (Signed)
cialis has been approved with a notice saying that benefit limits apply  For treatment of ED typically 4 for 30 days; over this limit cannot be requested. Patient notified and left message on pharm vm

## 2015-10-07 ENCOUNTER — Encounter: Payer: Self-pay | Admitting: Family Medicine

## 2015-11-28 NOTE — Addendum Note (Signed)
Addended by: Teddy Spike on: 11/28/2015 09:21 AM   Modules accepted: Orders

## 2015-12-01 ENCOUNTER — Other Ambulatory Visit: Payer: Self-pay | Admitting: Family Medicine

## 2016-03-17 ENCOUNTER — Encounter: Payer: Self-pay | Admitting: Family Medicine

## 2016-03-17 DIAGNOSIS — R748 Abnormal levels of other serum enzymes: Secondary | ICD-10-CM | POA: Insufficient documentation

## 2016-04-20 DIAGNOSIS — H40053 Ocular hypertension, bilateral: Secondary | ICD-10-CM | POA: Diagnosis not present

## 2016-05-25 ENCOUNTER — Ambulatory Visit (INDEPENDENT_AMBULATORY_CARE_PROVIDER_SITE_OTHER): Payer: Medicare Other | Admitting: Family Medicine

## 2016-05-25 ENCOUNTER — Other Ambulatory Visit: Payer: Self-pay | Admitting: Family Medicine

## 2016-05-25 ENCOUNTER — Ambulatory Visit (INDEPENDENT_AMBULATORY_CARE_PROVIDER_SITE_OTHER): Payer: Medicare Other

## 2016-05-25 ENCOUNTER — Encounter: Payer: Self-pay | Admitting: Family Medicine

## 2016-05-25 VITALS — BP 127/57 | HR 54 | Ht 72.0 in | Wt 169.0 lb

## 2016-05-25 DIAGNOSIS — M25531 Pain in right wrist: Secondary | ICD-10-CM | POA: Diagnosis not present

## 2016-05-25 DIAGNOSIS — M791 Myalgia, unspecified site: Secondary | ICD-10-CM

## 2016-05-25 DIAGNOSIS — R600 Localized edema: Secondary | ICD-10-CM | POA: Diagnosis not present

## 2016-05-25 DIAGNOSIS — M7989 Other specified soft tissue disorders: Secondary | ICD-10-CM | POA: Diagnosis not present

## 2016-05-25 DIAGNOSIS — R748 Abnormal levels of other serum enzymes: Secondary | ICD-10-CM | POA: Diagnosis not present

## 2016-05-25 DIAGNOSIS — M25532 Pain in left wrist: Secondary | ICD-10-CM | POA: Diagnosis not present

## 2016-05-25 NOTE — Progress Notes (Signed)
Subjective:    Patient ID: Bobby Lewis, male    DOB: 01-04-1951, 66 y.o.   MRN: [id number]  HPI 66 yo c/o right wrist pain This and going on for about 3 months. He says initially it was just some mild discomfort but now actually looks swollen to him.It bothers him the most when he tries to do his weekly yoga class. When he puts his hands flat on the floor it's quite painful and is not able to fully do it. He denies any known injury or trauma to that wrist. He's not really been taking medication for it or doing anything actively to reduce the swelling. He was able to golf the other day and says it didn't really interfere.  She's also noticed that his left ankle has been a little bit more swollen with some redness medially above the ankle. He says that's actually been going on for maybe a month. That he's had a little bit of ankle swelling going on for several months. Overall it doesn't really bother him. It's not painful or itchy.  He has had problems with elevated liver enzymes and is currently being followed by gastroneurology for this. In fact he is due to repeat his AST and ALTs in a few weeks with them. He has thus far had a negative workup.    Review of Systems   Also complains of muscle pain and fatie does yoga once a week and says normally he is a little sohe next day but lately he sore but he never really seems to fully   BP (!) 127/57   Pulse (!) 54   Ht 6' (1.829 m)   Wt 169 lb (76.7 kg)   SpO2 99%   BMI 22.92 kg/m     No Known Allergies  Past Medical History:  Diagnosis Date  . COPD (chronic obstructive pulmonary disease) ([place])   . Genital warts    history  . Tobacco abuse     Past Surgical History:  Procedure Laterality Date  . NOSE SURGERY  2002    Social History   Social History  . Marital status: Married    Spouse name: N/A  . Number of children: N/A  . Years of education: N/A   Occupational History  . Not on file.   Social History Main Topics   . Smoking status: Former Smoker    Packs/day: 1.00    Years: 25.00    Types: Cigarettes    Quit date: 02/01/2009  . Smokeless tobacco: Never Used  . Alcohol use 1.5 oz/week    3 Standard drinks or equivalent per week     Comment: daily  . Drug use: No  . 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   Other Topics Concern  . Not on file   Social History Narrative  . No narrative on file    Family History  Problem Relation Age of Onset  . Joint hypermobility Mother   . Heart attack Father 79    died 74    Outpatient Encounter Prescriptions as of 05/25/2016  Medication Sig  . aspirin 81 MG tablet Take 81 mg by mouth daily.    Marland Kitchen atorvastatin (LIPITOR) 40 MG tablet Take 1 tablet (40 mg total) by mouth at bedtime.  . Multiple Vitamin (MULTIVITAMIN PO) Take by mouth.    . Omega-3 Fatty  Acids (FISH OIL) 1000 MG CAPS Take 2,000 mg by mouth daily.  Marland Kitchen omeprazole (PRILOSEC) 20 MG capsule TAKE 1 CAPSULE (20 MG TOTAL) BY MOUTH DAILY.  . [DISCONTINUED] diclofenac sodium (VOLTAREN) 1 % GEL Apply 2 g topically 4 (four) times daily. To affected joint.  . [DISCONTINUED] imiquimod (ALDARA) 5 % cream Apply topically 2 (two) times a week.  . [DISCONTINUED] tadalafil (CIALIS) 20 MG tablet Take 1 tablet (20 mg total) by mouth daily as needed for erectile dysfunction.   No facility-administered encounter medications on file as of 05/25/2016.     \    Objective:   Physical Exam  Constitutional: He is oriented to person, place, and time. He appears well-developed and well-nourished.  HENT:  Head: Normocephalic and atraumatic.  Cardiovascular: Normal rate, regular rhythm and normal heart sounds.   Pulmonary/Chest: Effort normal and breath sounds normal.  Musculoskeletal:  Right wrist with some mild swelling dorsally and over the dorsum of the hand and a little bit of swelling proximally  going towards the elbow. No bruising or significant discoloration of the skin. Nontender over the wrist joint or forearm. Strength is 5 out of 5 in all directions.  Neurological: He is alert and oriented to person, place, and time.  Skin: Skin is warm and dry.  Both ankles with 1+ pitting edema particular around the ankles with some trace edema to mid tibia bilaterally. He has some discoloration of the skin over the left medial leg just at the base of the calf. It's tender over that area as well with palpation.  Psychiatric: He has a normal mood and affect. His behavior is normal.          Assessment & Plan:  Right wrist pain - Unclear etiology suspect osteoarthritis as the cause with maybe a little tendinopathy. Will start with plain film x-ray called results once available. Bilateral ankle swelling-Differential includes venous stasis dermatitis versus worsening liver problems versus renal dysfunction versus cardiac etiology. We'll get some additional blood work today. Chest and cardiac exam is normal today. Consider trial with anti-inflammatory if needed. Avoid Tylenol because of elevated liver enzymes.  Left lower leg swelling with skin changes-I will check a d-dimer today just to rule out DVT or superficial clot. We need to get an ultrasound comes back elevated. Also consider that this could be venous stasis dermatitis.  Myalgias-Will check a CK level as well as inflammatory markers.  Elevated liver enzymes -recheck AST and ALTs. Even had an essentially normal liver biopsy.

## 2016-05-26 ENCOUNTER — Ambulatory Visit (HOSPITAL_BASED_OUTPATIENT_CLINIC_OR_DEPARTMENT_OTHER)
Admission: RE | Admit: 2016-05-26 | Discharge: 2016-05-26 | Disposition: A | Discharge status: Home / Self Care | Payer: Medicare Other | Source: Ambulatory Visit | Attending: Family Medicine | Admitting: Family Medicine

## 2016-05-26 ENCOUNTER — Telehealth: Payer: Self-pay | Admitting: *Deleted

## 2016-05-26 ENCOUNTER — Ambulatory Visit (INDEPENDENT_AMBULATORY_CARE_PROVIDER_SITE_OTHER): Payer: Medicare Other | Admitting: Physician Assistant

## 2016-05-26 ENCOUNTER — Other Ambulatory Visit: Payer: Self-pay | Admitting: Family Medicine

## 2016-05-26 VITALS — BP 117/52 | HR 52 | Wt 165.0 lb

## 2016-05-26 DIAGNOSIS — I708 Atherosclerosis of other arteries: Secondary | ICD-10-CM | POA: Diagnosis not present

## 2016-05-26 DIAGNOSIS — M25531 Pain in right wrist: Secondary | ICD-10-CM

## 2016-05-26 DIAGNOSIS — R7989 Other specified abnormal findings of blood chemistry: Secondary | ICD-10-CM | POA: Diagnosis present

## 2016-05-26 DIAGNOSIS — M7989 Other specified soft tissue disorders: Secondary | ICD-10-CM | POA: Diagnosis not present

## 2016-05-26 LAB — TSH: TSH: 137.96 mIU/L — ABNORMAL HIGH (ref 0.40–4.50)

## 2016-05-26 LAB — COMPLETE METABOLIC PANEL WITH GFR
AG Ratio: 1.6 Ratio (ref 1.0–2.5)
ALT: 45 U/L (ref 9–46)
AST: 50 U/L — ABNORMAL HIGH (ref 10–35)
Albumin: 4.2 g/dL (ref 3.6–5.1)
Alkaline Phosphatase: 66 U/L (ref 40–115)
BUN / CREAT RATIO: 11.5 ratio (ref 6–22)
BUN: 10 mg/dL (ref 7–25)
CO2: 27 mmol/L (ref 20–31)
Calcium: 9.3 mg/dL (ref 8.6–10.3)
Chloride: 105 mmol/L (ref 98–110)
Creat: 0.87 mg/dL (ref 0.70–1.25)
Globulin: 2.7 g/dL (ref 1.9–3.7)
Glucose, Bld: 96 mg/dL (ref 65–99)
POTASSIUM: 4.6 mmol/L (ref 3.5–5.3)
SODIUM: 141 mmol/L (ref 135–146)
Total Bilirubin: 0.6 mg/dL (ref 0.2–1.2)
Total Protein: 6.9 g/dL (ref 6.1–8.1)

## 2016-05-26 LAB — T4, FREE: FREE T4: 0.2 ng/dL — AB (ref 0.8–1.8)

## 2016-05-26 LAB — CK: CK TOTAL: 295 U/L — AB (ref 7–232)

## 2016-05-26 LAB — CBC WITH DIFFERENTIAL/PLATELET
BASOS ABS: 88 {cells}/uL (ref 0–200)
Basophils Relative: 1 %
Eosinophils Absolute: 792 cells/uL — ABNORMAL HIGH (ref 15–500)
Eosinophils Relative: 9 %
HCT: 38.6 % (ref 38.5–50.0)
Hemoglobin: 12.4 g/dL — ABNORMAL LOW (ref 13.2–17.1)
Lymphocytes Relative: 13 %
Lymphs Abs: 1144 cells/uL (ref 850–3900)
MCH: 31.2 pg (ref 27.0–33.0)
MCHC: 32.1 g/dL (ref 32.0–36.0)
MCV: 97.2 fL (ref 80.0–100.0)
MONOS PCT: 7 %
MPV: 10.3 fL (ref 7.5–12.5)
Monocytes Absolute: 616 cells/uL (ref 200–950)
NEUTROS PCT: 70 %
Neutro Abs: 6160 cells/uL (ref 1500–7800)
PLATELETS: 314 10*3/uL (ref 140–400)
RBC: 3.97 MIL/uL — ABNORMAL LOW (ref 4.20–5.80)
RDW: 14.7 % (ref 11.0–15.0)
WBC: 8.8 10*3/uL (ref 3.8–10.8)

## 2016-05-26 LAB — URINALYSIS
Bilirubin Urine: NEGATIVE
Glucose, UA: NEGATIVE
Hgb urine dipstick: NEGATIVE
Ketones, ur: NEGATIVE
LEUKOCYTES UA: NEGATIVE
Nitrite: NEGATIVE
Protein, ur: NEGATIVE
SPECIFIC GRAVITY, URINE: 1.016 (ref 1.001–1.035)
pH: 6.5 (ref 5.0–8.0)

## 2016-05-26 LAB — SEDIMENTATION RATE: SED RATE: 4 mm/h (ref 0–20)

## 2016-05-26 LAB — C-REACTIVE PROTEIN: CRP: 6.1 mg/L (ref <8.0)

## 2016-05-26 LAB — D-DIMER, QUANTITATIVE (NOT AT ARMC): D DIMER QUANT: 1.85 ug{FEU}/mL — AB (ref <0.50)

## 2016-05-26 NOTE — Telephone Encounter (Signed)
Patients wife called and wanted to know since the ultrasound was normal what is the next step?

## 2016-05-26 NOTE — Progress Notes (Signed)
Pt came into clinic today to be fitted for a right wrist splint. Splint applied and advised Pt on application/removal. DME order form completed. Did go over US Doppler results in office. No further questions.   Above plan reviewed and agree with. Iran Planas PA-C

## 2016-05-26 NOTE — Telephone Encounter (Signed)
Spouse states he is still very tender on his arms and legs

## 2016-05-27 ENCOUNTER — Encounter: Payer: Self-pay | Admitting: Family Medicine

## 2016-05-27 MED ORDER — LEVOTHYROXINE SODIUM 88 MCG PO TABS: 88.0000 ug | ORAL_TABLET | Freq: Every day | ORAL | 1 refills | Status: DC

## 2016-05-27 NOTE — Telephone Encounter (Signed)
Call pt directly.  I was waiting for the extra thyroid test to come back.  I am going to start him on thyroid medication. I sent over rx today. Start one a day in the morning about an hour before breakfast away from aother pills/meds.  Make sur to take any MVI product at least 4 hours apart.  Recheck TSH and free t4 in 6 weeks.  I think most of his sxs are related to his thyroid.  Have them schedule f/u with me in 6 weeks as well so can discuss in more detal and see if he is getting better.    Beatrice Lecher, MD

## 2016-05-27 NOTE — Telephone Encounter (Signed)
Patient is here in the office today so I went over instructions here in the office.

## 2016-05-28 ENCOUNTER — Telehealth: Payer: Self-pay | Admitting: Family Medicine

## 2016-05-28 ENCOUNTER — Encounter: Payer: Self-pay | Admitting: Family Medicine

## 2016-05-28 NOTE — Telephone Encounter (Signed)
Pt med listed updated to show Pt is taking 20mg  Lipitor daily.

## 2016-06-10 DIAGNOSIS — D649 Anemia, unspecified: Secondary | ICD-10-CM | POA: Diagnosis not present

## 2016-06-10 DIAGNOSIS — R600 Localized edema: Secondary | ICD-10-CM | POA: Diagnosis not present

## 2016-06-10 DIAGNOSIS — D539 Nutritional anemia, unspecified: Secondary | ICD-10-CM | POA: Diagnosis not present

## 2016-06-10 DIAGNOSIS — R945 Abnormal results of liver function studies: Secondary | ICD-10-CM | POA: Diagnosis not present

## 2016-06-12 ENCOUNTER — Encounter: Payer: Self-pay | Admitting: Family Medicine

## 2016-06-14 ENCOUNTER — Other Ambulatory Visit: Payer: Self-pay | Admitting: *Deleted

## 2016-06-14 ENCOUNTER — Telehealth: Payer: Self-pay | Admitting: *Deleted

## 2016-06-14 DIAGNOSIS — E039 Hypothyroidism, unspecified: Secondary | ICD-10-CM

## 2016-06-14 LAB — TSH: TSH: 53.03 m[IU]/L — AB (ref 0.40–4.50)

## 2016-06-14 MED ORDER — ATORVASTATIN CALCIUM 20 MG PO TABS: 20.0000 mg | ORAL_TABLET | Freq: Every day | ORAL | 3 refills | Status: DC

## 2016-06-14 NOTE — Telephone Encounter (Signed)
Labs faxed. lvm informing pt.Bobby Lewis

## 2016-06-15 ENCOUNTER — Telehealth: Payer: Self-pay | Admitting: Emergency Medicine

## 2016-06-15 ENCOUNTER — Other Ambulatory Visit: Payer: Self-pay | Admitting: Family Medicine

## 2016-06-15 MED ORDER — LEVOTHYROXINE SODIUM 100 MCG PO TABS: 100.0000 ug | ORAL_TABLET | Freq: Every day | ORAL | 1 refills | Status: DC

## 2016-06-15 NOTE — Telephone Encounter (Signed)
OK, let move appt out 4 weeks.

## 2016-06-15 NOTE — Telephone Encounter (Signed)
Patient calling; since needs TSH level in 4 weeks he wonders if he should cancel tomorrow's appt and do entire lab and appt in 4 weeks?pk

## 2016-06-16 ENCOUNTER — Ambulatory Visit (INDEPENDENT_AMBULATORY_CARE_PROVIDER_SITE_OTHER): Payer: Medicare Other | Admitting: Family Medicine

## 2016-06-16 ENCOUNTER — Encounter: Payer: Self-pay | Admitting: Family Medicine

## 2016-06-16 VITALS — BP 104/42 | HR 57 | Wt 164.0 lb

## 2016-06-16 DIAGNOSIS — M79604 Pain in right leg: Secondary | ICD-10-CM

## 2016-06-16 DIAGNOSIS — E039 Hypothyroidism, unspecified: Secondary | ICD-10-CM

## 2016-06-16 DIAGNOSIS — M7989 Other specified soft tissue disorders: Secondary | ICD-10-CM

## 2016-06-16 DIAGNOSIS — R748 Abnormal levels of other serum enzymes: Secondary | ICD-10-CM

## 2016-06-16 DIAGNOSIS — D649 Anemia, unspecified: Secondary | ICD-10-CM | POA: Diagnosis not present

## 2016-06-16 NOTE — Progress Notes (Signed)
Subjective:    CC: Follow-up new diagnosis of hypothyroidism  HPI:  Here today to follow-up for new diagnosis of hypothyroidism. He had come in about 63 weeks ago with right wrist pain and lower extremity swelling with some skin changes. We ruled out a DVT. His TSH came back quite elevated on lab work. He also recently had problems with elevated liver enzymes. Had a recent normal liver biopsy. It also had significant myalgias Initial TSH was 137. Repeat at 3 weeks was 53. Did bump him up to 100 g which she just started this morning. Overall he has felt significantly better. He still having some swelling in his forearms and particularly in his right foot and ankle. His energy levels are a little better and the myalgias are improved as well.  He did see Elberta Fortis pleasant at his gastroenterology office. They have recommended some additional lab testing for his anemia including B12, folate and iron. They also want to repeat his lipid panel. We have temporarily held his statin just make sure it wasn't causing any of his myalgias. He is now back on it and needs his lipids rechecked. Also given him stool cards to do and return to make sure that he is not losing blood in his stool.  Past medical history, Surgical history, Family history not pertinant except as noted below, Social history, Allergies, and medications have been entered into the medical record, reviewed, and corrections made.   Review of Systems: No fevers, chills, night sweats, weight loss, chest pain, or shortness of breath.   Objective:    General: Well Developed, well nourished, and in no acute distress.  Neuro: Alert and oriented x3, extra-ocular muscles intact, sensation grossly intact.  HEENT: Normocephalic, atraumatic  Skin: Warm and dry, no rashes. Cardiac: Regular rate and rhythm, no murmurs rubs or gallops, no lower extremity edema.  Respiratory: Clear to auscultation bilaterally. Not using accessory muscles, speaking in full  sentences. MSK:  He does have some significant swelling of his right ankle and foot. In fact he has some marks from his shoe across the top of his foot. He actually has some forearm edema as well. Again he has a marked where he was resting his arm on the basket on her Dinamap. It is trace pitting in the forearms and 1+ pitting on the dorsum of the right foot.   Impression and Recommendations:    Hypothyroidism-I do think a lot of his swelling is probably related to this. I think as we get his TSH under better control as he continues to feel better I do think that this will improve some. Some component of myxedema. He just started 100 g tabs today and we are going to recheck his labs in 4 weeks and I'll see him back for exam at that point. Next  Anemia-he is wanting to do the stool cards through GI and per the request we will go ahead and check his B12, folate iron etc. Next  Elevated liver enzymes-we'll recheck hepatic function in one month. I'm hoping that as his thyroid becomes better controlled the liver enzymes will go down as well.  Hyperlipidemia-he is now restarted his statin. We'll recheck lipid levels in 4 weeks.  Right foot edema-still significantly swollen but DVT has been ruled out. Can also consider arthritis but I also think the thyroid could be contributing and making this worse.   Forearm edema-clear etiology but could be related to the thyroid as well.

## 2016-06-17 ENCOUNTER — Encounter: Payer: Self-pay | Admitting: Family Medicine

## 2016-06-21 ENCOUNTER — Ambulatory Visit (INDEPENDENT_AMBULATORY_CARE_PROVIDER_SITE_OTHER): Payer: Medicare Other

## 2016-06-21 DIAGNOSIS — E039 Hypothyroidism, unspecified: Secondary | ICD-10-CM

## 2016-06-21 DIAGNOSIS — E041 Nontoxic single thyroid nodule: Secondary | ICD-10-CM | POA: Diagnosis not present

## 2016-06-25 DIAGNOSIS — D649 Anemia, unspecified: Secondary | ICD-10-CM | POA: Diagnosis not present

## 2016-06-30 IMAGING — US US ABDOMEN COMPLETE
1 series · 13 of 25 positions shown · non-contrast
Comparison: None in PACs

CLINICAL DATA: Elevated liver enzymes common no other symptoms ;
history of COPD.

EXAM:
ABDOMEN ULTRASOUND COMPLETE

[Series 1: us abdomen complete · 0.14mm/px · 13 of 96 slices shown]
[im 1/96]
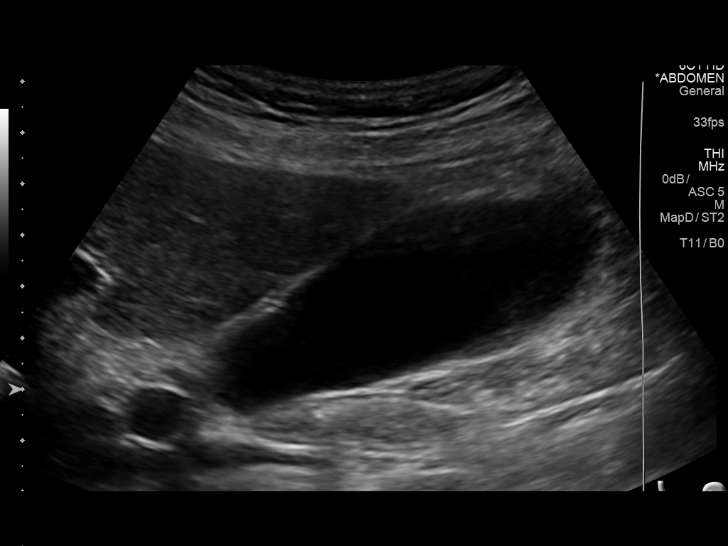
[im 8/96]
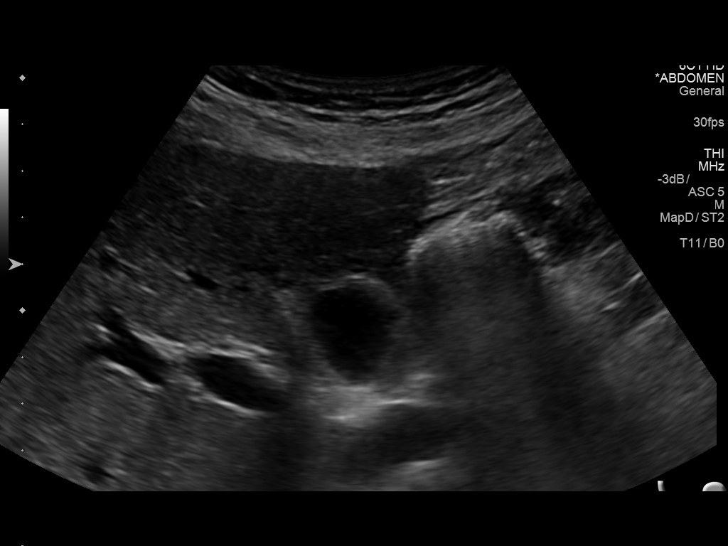
[im 16/96]
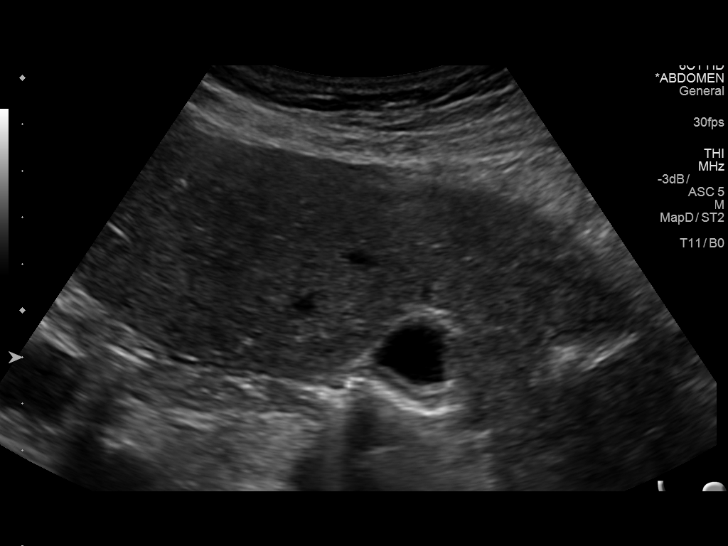
[im 24/96]
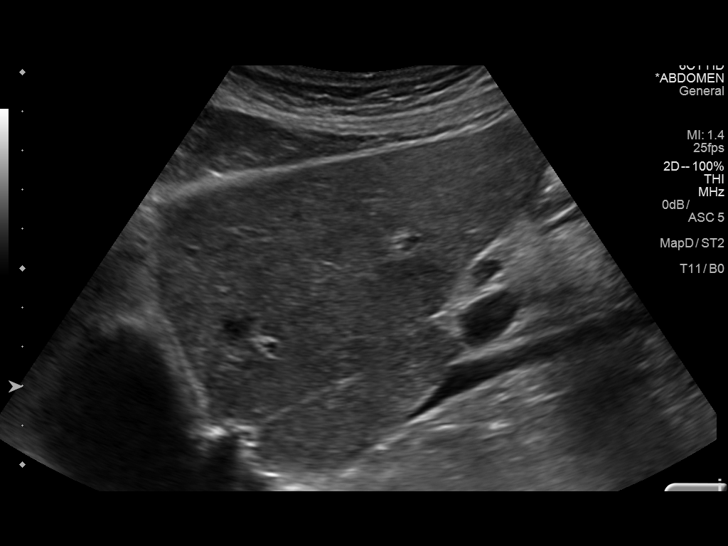
[im 32/96]
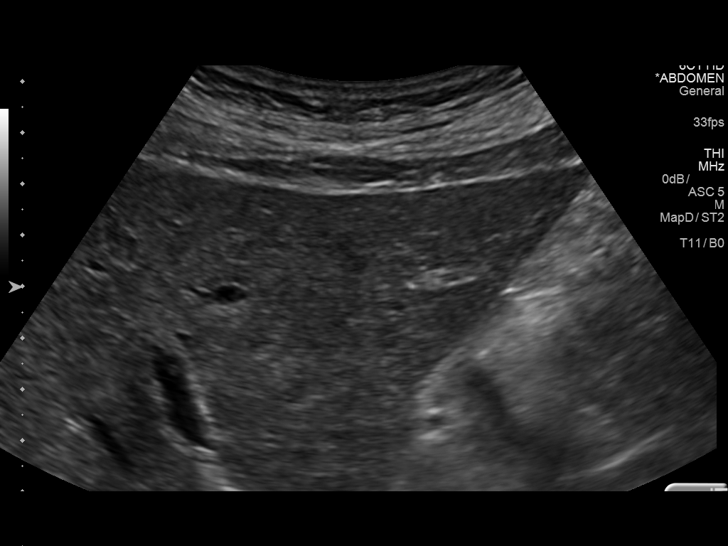
[im 40/96]
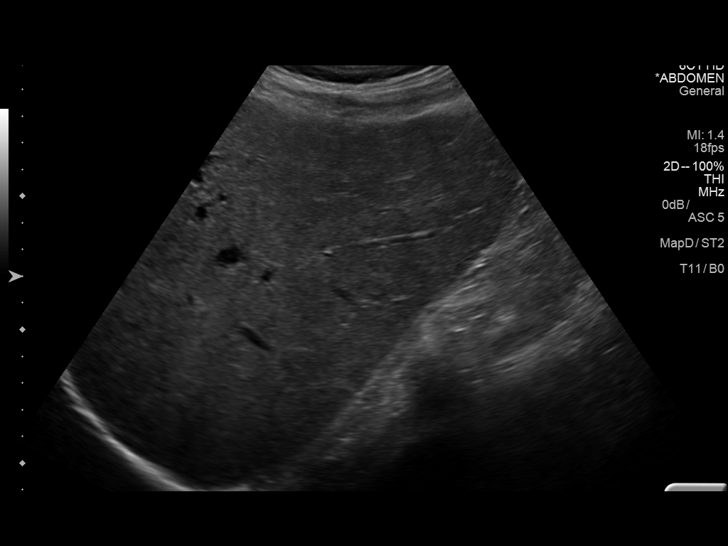
[im 48/96]
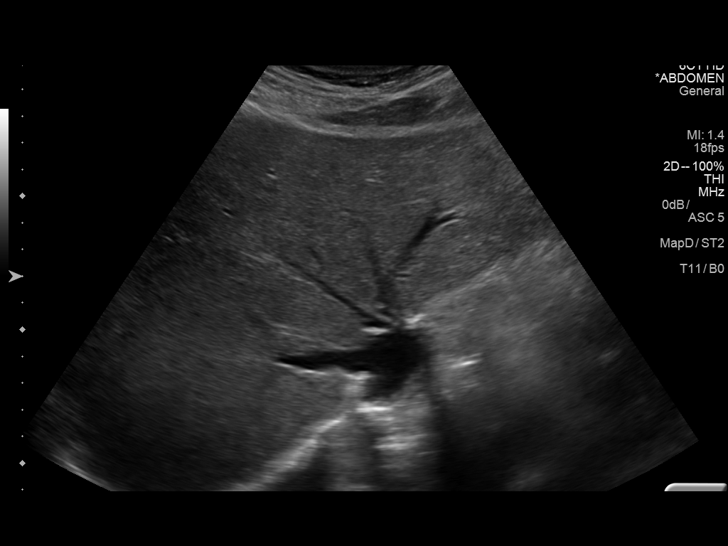
[im 56/96]
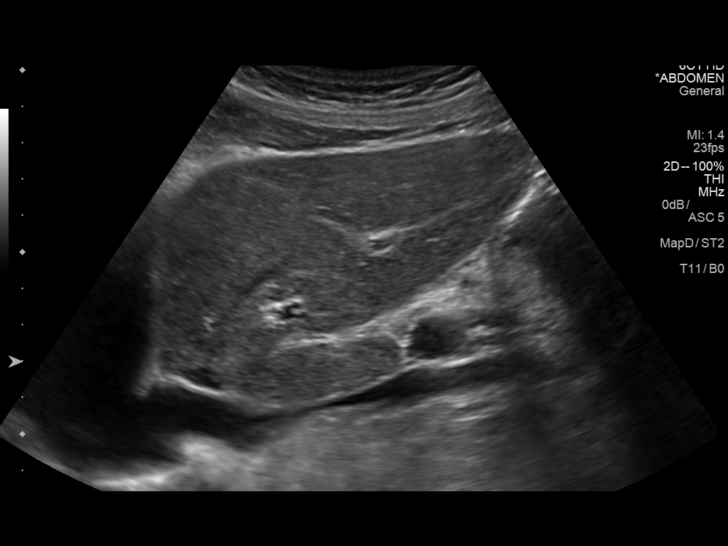
[im 64/96]
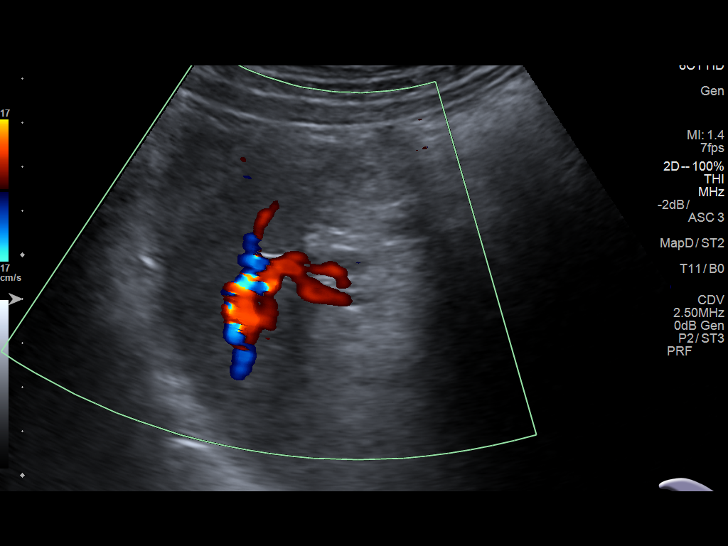
[im 72/96]
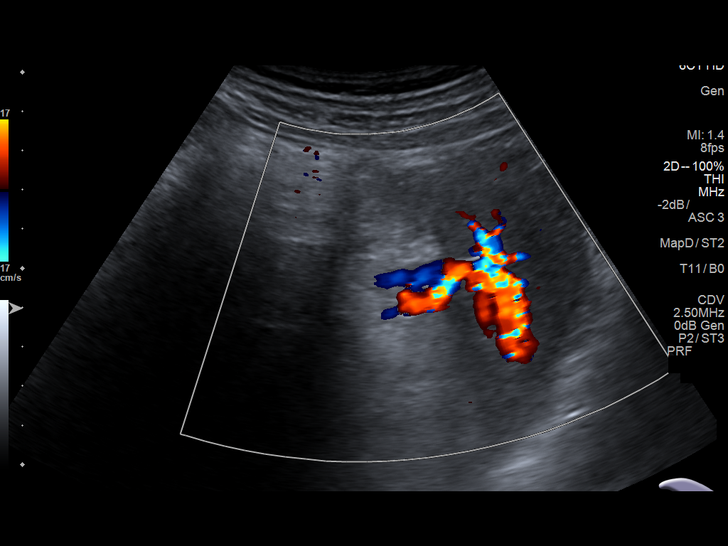
[im 80/96]
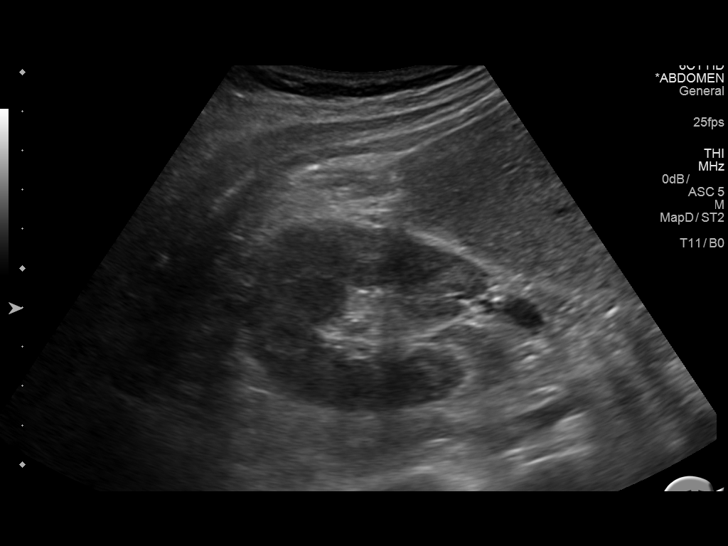
[im 88/96]
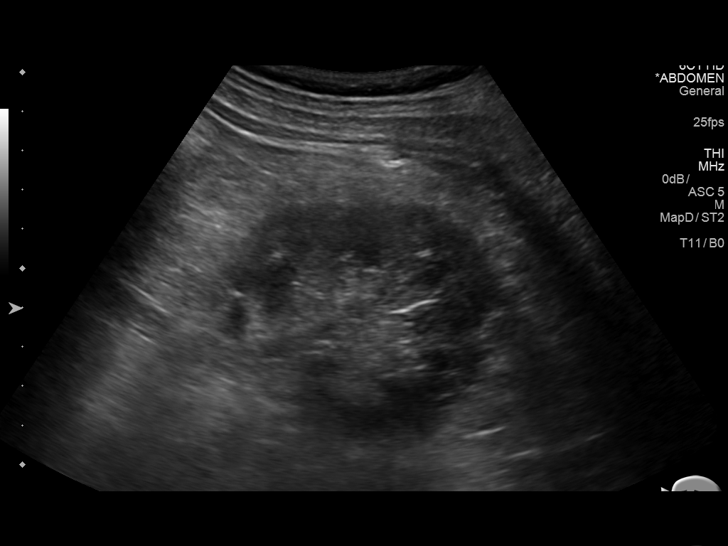
[im 96/96]
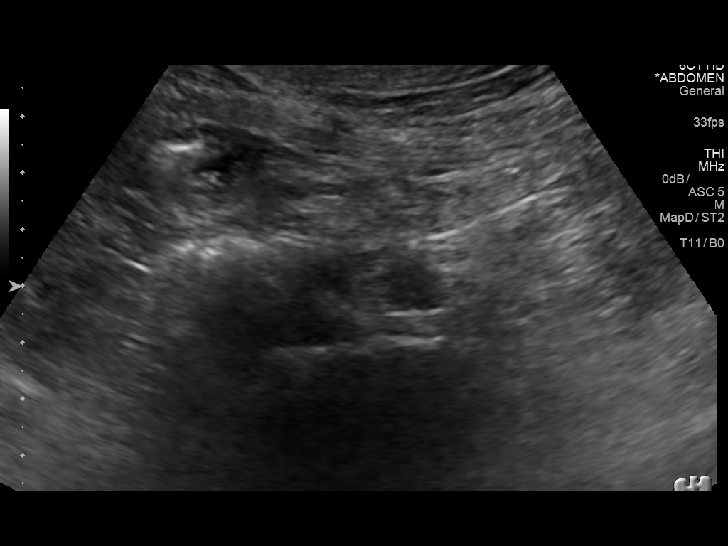

[13 of 25 positions shown; findings below may reference images not displayed]

FINDINGS: Gallbladder: No gallstones or wall thickening visualized. No
sonographic Murphy sign noted by sonographer.

Common bile duct: Diameter: 2.5 mm

Liver: In the left hepatic lobe there is an anechoic lesion
measuring 1.5 x 1.2 x 1.3 cm. There is no intrahepatic ductal
dilation. The echotexture of the liver elsewhere is normal.

IVC: Bowel gas limits evaluation of the inferior vena cava.

Pancreas: Bowel gas limits evaluation of portions of the pancreatic
head and tail.

Spleen: Size and appearance within normal limits.

Right Kidney: Length: 11.8 cm. Echogenicity within normal limits. No
mass or hydronephrosis visualized.

Left Kidney: Length: 10.8 cm. Echogenicity within normal limits. No
mass or hydronephrosis visualized.

Abdominal aorta: Bowel gas limits evaluation of the abdominal aorta.
No aneurysm is demonstrated.

Other findings: No ascites is observed.
IMPRESSION: 1. No acute hepatobiliary abnormality is observed. There is simple
appearing left lobe cyst.
2. Limited visualization of the pancreas, abdominal aorta, and IVC
due to bowel gas.
3. The kidneys and spleen are unremarkable.

## 2016-07-12 ENCOUNTER — Other Ambulatory Visit: Payer: Self-pay | Admitting: Family Medicine

## 2016-07-12 DIAGNOSIS — E039 Hypothyroidism, unspecified: Secondary | ICD-10-CM | POA: Diagnosis not present

## 2016-07-12 DIAGNOSIS — D539 Nutritional anemia, unspecified: Secondary | ICD-10-CM | POA: Diagnosis not present

## 2016-07-12 DIAGNOSIS — R209 Unspecified disturbances of skin sensation: Secondary | ICD-10-CM | POA: Diagnosis not present

## 2016-07-12 DIAGNOSIS — E639 Nutritional deficiency, unspecified: Secondary | ICD-10-CM | POA: Diagnosis not present

## 2016-07-12 DIAGNOSIS — D649 Anemia, unspecified: Secondary | ICD-10-CM | POA: Diagnosis not present

## 2016-07-12 DIAGNOSIS — Z1321 Encounter for screening for nutritional disorder: Secondary | ICD-10-CM | POA: Diagnosis not present

## 2016-07-12 DIAGNOSIS — R748 Abnormal levels of other serum enzymes: Secondary | ICD-10-CM | POA: Diagnosis not present

## 2016-07-13 ENCOUNTER — Other Ambulatory Visit: Payer: Self-pay | Admitting: Family Medicine

## 2016-07-13 LAB — FOLATE: FOLATE: 19.8 ng/mL (ref >5.4)

## 2016-07-13 LAB — LIPID PANEL W/REFLEX DIRECT LDL
CHOL/HDL RATIO: 3.1 ratio (ref <5.0)
CHOLESTEROL: 95 mg/dL (ref <200)
HDL: 31 mg/dL — AB (ref >40)
LDL-Cholesterol: 45 mg/dL
NON-HDL CHOLESTEROL (CALC): 64 mg/dL (ref <130)
TRIGLYCERIDES: 104 mg/dL (ref <150)

## 2016-07-13 LAB — FERRITIN: Ferritin: 62 ng/mL (ref 20–380)

## 2016-07-13 LAB — IRON: Iron: 26 ug/dL — ABNORMAL LOW (ref 50–180)

## 2016-07-13 LAB — HEPATIC FUNCTION PANEL
ALBUMIN: 3.6 g/dL (ref 3.6–5.1)
ALT: 8 U/L — AB (ref 9–46)
AST: 11 U/L (ref 10–35)
Alkaline Phosphatase: 77 U/L (ref 40–115)
Bilirubin, Direct: 0.2 mg/dL (ref <=0.2)
Indirect Bilirubin: 0.6 mg/dL (ref 0.2–1.2)
TOTAL PROTEIN: 6.4 g/dL (ref 6.1–8.1)
Total Bilirubin: 0.8 mg/dL (ref 0.2–1.2)

## 2016-07-13 LAB — TSH: TSH: 13.98 mIU/L — ABNORMAL HIGH (ref 0.40–4.50)

## 2016-07-13 LAB — VITAMIN B12: VITAMIN B 12: 387 pg/mL (ref 200–1100)

## 2016-07-13 MED ORDER — LEVOTHYROXINE SODIUM 125 MCG PO TABS: 125.0000 ug | ORAL_TABLET | Freq: Every day | ORAL | 1 refills | Status: DC

## 2016-07-14 ENCOUNTER — Ambulatory Visit (INDEPENDENT_AMBULATORY_CARE_PROVIDER_SITE_OTHER): Payer: Medicare Other | Admitting: Family Medicine

## 2016-07-14 ENCOUNTER — Encounter: Payer: Self-pay | Admitting: Family Medicine

## 2016-07-14 VITALS — BP 111/62 | HR 61 | Ht 71.56 in | Wt 154.0 lb

## 2016-07-14 DIAGNOSIS — E039 Hypothyroidism, unspecified: Secondary | ICD-10-CM | POA: Diagnosis not present

## 2016-07-14 DIAGNOSIS — M791 Myalgia, unspecified site: Secondary | ICD-10-CM

## 2016-07-14 DIAGNOSIS — R634 Abnormal weight loss: Secondary | ICD-10-CM | POA: Diagnosis not present

## 2016-07-14 DIAGNOSIS — R7301 Impaired fasting glucose: Secondary | ICD-10-CM | POA: Diagnosis not present

## 2016-07-14 DIAGNOSIS — E611 Iron deficiency: Secondary | ICD-10-CM

## 2016-07-14 DIAGNOSIS — Z23 Encounter for immunization: Secondary | ICD-10-CM | POA: Diagnosis not present

## 2016-07-14 LAB — CK: CK TOTAL: 35 U/L — AB (ref 44–196)

## 2016-07-14 NOTE — Progress Notes (Signed)
Subjective:    CC: IFG   HPI: pt's wife is concerned about his wt loss and low energy. she also mentioned his AST/ALT levels.  Impaired fasting glucose-no increased thirst or urination. No symptoms consistent with hypoglycemia.He has been eating some ice cream more recently.  Hypothyroidism- TSH is finally trending down. Started 125 mcg daily.  There was a typo on the result note.  He unfortunately is still expressing a lot of soreness and tightness in his muscles particularly the calves thighs and forearms. He does feel like his swelling and edema around the ankles feet and forearms have improved significantly. He still just continues to have a lot of soreness and pain have really decreased his activity levels. In fact normally he left the cough and he hasn't gone in almost 3 months.  Lab Results  Component Value Date   TSH 13.98 (H) 07/12/2016   He still feels extremely fatigued and is napping frequently. They recently labs revealed iron deficiency.  Past medical history, Surgical history, Family history not pertinant except as noted below, Social history, Allergies, and medications have been entered into the medical record, reviewed, and corrections made.   Review of Systems: No fevers, chills, night sweats, weight loss, chest pain, or shortness of breath.   Objective:    General: Well Developed, well nourished, and in no acute distress.  Neuro: Alert and oriented x3, extra-ocular muscles intact, sensation grossly intact.  HEENT: Normocephalic, atraumatic  Skin: Warm and dry, no rashes. Cardiac: Regular rate and rhythm, no murmurs rubs or gallops, no lower extremity edema.  Respiratory: Clear to auscultation bilaterally. Not using accessory muscles, speaking in full sentences.   Impression and Recommendations:    IFG - Well controlled. Hemoglobin A1c looks fantastic today. In fact we actually discussed increasing his calories.  Hypothyroid - TSH is improving significantly. Now  up to 125 g daily. We'll recheck his labs in about 8 weeks.  Abnormal weight loss - discussed increasing his calories by about 200 a day. We discussed some healthy snacks such as nuts etc. which can be high in calories but good fats. I would like to see his weight come up. If not then he can do need some additional workup.  Low iron he was office so noted on his labs to have low iron. His colonoscopy is up-to-date in fact GI recently did stool cards which were negative.  Myalgias-his recent sedimentation rate, and CRP were normal. His CK was just mildly elevated similar to call and see if we can add that onto his regular labs to see if that's trending downward or maybe even upward.  Prevnar 13 given today now that he is 47.

## 2016-07-14 NOTE — Patient Instructions (Addendum)
Increase daily calorie intake by about 200 calories per day.     Iron-Rich Diet Iron is a mineral that helps your body to produce hemoglobin. Hemoglobin is a protein in your red blood cells that carries oxygen to your body's tissues. Eating too little iron may cause you to feel weak and tired, and it can increase your risk for infection. Eating enough iron is necessary for your body's metabolism, muscle function, and nervous system. Iron is naturally found in many foods. It can also be added to foods or fortified in foods. There are two types of dietary iron:  Heme iron. Heme iron is absorbed by the body more easily than nonheme iron. Heme iron is found in meat, poultry, and fish.  Nonheme iron. Nonheme iron is found in dietary supplements, iron-fortified grains, beans, and vegetables.  You may need to follow an iron-rich diet if:  You have been diagnosed with iron deficiency or iron-deficiency anemia.  You have a condition that prevents you from absorbing dietary iron, such as: ? Infection in your intestines. ? Celiac disease. This involves long-lasting (chronic) inflammation of your intestines.  You do not eat enough iron.  You eat a diet that is high in foods that impair iron absorption.  You have lost a lot of blood.  You have heavy bleeding during your menstrual cycle.  You are pregnant.  What is my plan? Your health care provider may help you to determine how much iron you need per day based on your condition. Generally, when a person consumes sufficient amounts of iron in the diet, the following iron needs are met:  Men. ? 70-52 years old: 11 mg per day. ? 33-2 years old: 8 mg per day.  Women. ? 45-42 years old: 15 mg per day. ? 34-17 years old: 18 mg per day. ? Over 81 years old: 8 mg per day. ? Pregnant women: 27 mg per day. ? Breastfeeding women: 9 mg per day.  What do I need to know about an iron-rich diet?  Eat fresh fruits and vegetables that are high in  vitamin C along with foods that are high in iron. This will help increase the amount of iron that your body absorbs from food, especially with foods containing nonheme iron. Foods that are high in vitamin C include oranges, peppers, tomatoes, and mango.  Take iron supplements only as directed by your health care provider. Overdose of iron can be life-threatening. If you were prescribed iron supplements, take them with orange juice or a vitamin C supplement.  Cook foods in pots and pans that are made from iron.  Eat nonheme iron-containing foods alongside foods that are high in heme iron. This helps to improve your iron absorption.  Certain foods and drinks contain compounds that impair iron absorption. Avoid eating these foods in the same meal as iron-rich foods or with iron supplements. These include: ? Coffee, black tea, and red wine. ? Milk, dairy products, and foods that are high in calcium. ? Beans, soybeans, and peas. ? Whole grains.  When eating foods that contain both nonheme iron and compounds that impair iron absorption, follow these tips to absorb iron better. ? Soak beans overnight before cooking. ? Soak whole grains overnight and drain them before using. ? Ferment flours before baking, such as using yeast in bread dough. What foods can I eat? Grains Iron-fortified breakfast cereal. Iron-fortified whole-wheat bread. Enriched rice. Sprouted grains. Vegetables Spinach. Potatoes with skin. Green peas. Broccoli. Red and green bell peppers.  Fermented vegetables. Fruits Prunes. Raisins. Oranges. Strawberries. Mango. Grapefruit. Meats and Other Protein Sources Beef liver. Oysters. Beef. Shrimp. [place]. Chicken. [place]. Sardines. Chickpeas. Nuts. Tofu. Beverages Tomato juice. Fresh orange juice. Prune juice. Hibiscus tea. Fortified instant breakfast shakes. Condiments Tahini. Fermented soy sauce. Sweets and Desserts Black-strap molasses. Other Wheat germ. The items listed above  may not be a complete list of recommended foods or beverages. Contact your dietitian for more options. What foods are not recommended? Grains Whole grains. Bran cereal. Bran flour. Oats. Vegetables Artichokes. Brussels sprouts. Kale. Fruits Blueberries. Raspberries. Strawberries. Figs. Meats and Other Protein Sources Soybeans. Products made from soy protein. Dairy Milk. Cream. Cheese. Yogurt. Cottage cheese. Beverages Coffee. Black tea. Red wine. Sweets and Desserts Cocoa. Chocolate. Ice cream. Other Basil. Oregano. Parsley. The items listed above may not be a complete list of foods and beverages to avoid. Contact your dietitian for more information. This information is not intended to replace advice given to you by your health care provider. Make sure you discuss any questions you have with your health care provider. Document Released: 09/01/2004 Document Revised: 08/08/2015 Document Reviewed: 08/15/2013 Elsevier Interactive Patient Education  [employer].

## 2016-07-27 ENCOUNTER — Encounter: Payer: Self-pay | Admitting: Family Medicine

## 2016-07-28 ENCOUNTER — Encounter: Payer: Self-pay | Admitting: Family Medicine

## 2016-07-28 NOTE — Telephone Encounter (Signed)
Letter printed. Pt will pick up.

## 2016-08-09 ENCOUNTER — Other Ambulatory Visit: Payer: Self-pay | Admitting: Family Medicine

## 2016-08-09 DIAGNOSIS — E039 Hypothyroidism, unspecified: Secondary | ICD-10-CM | POA: Diagnosis not present

## 2016-08-09 LAB — TSH
TSH: 5.52 (ref 0.41–5.90)
TSH: 5.52 mIU/L — ABNORMAL HIGH (ref 0.40–4.50)

## 2016-08-11 DIAGNOSIS — E039 Hypothyroidism, unspecified: Secondary | ICD-10-CM | POA: Diagnosis not present

## 2016-08-11 DIAGNOSIS — R945 Abnormal results of liver function studies: Secondary | ICD-10-CM | POA: Diagnosis not present

## 2016-08-12 ENCOUNTER — Encounter: Payer: Self-pay | Admitting: Family Medicine

## 2016-08-12 MED ORDER — LEVOTHYROXINE SODIUM 137 MCG PO TABS: 137.0000 ug | ORAL_TABLET | Freq: Every day | ORAL | 1 refills | Status: DC

## 2016-08-14 ENCOUNTER — Encounter: Payer: Self-pay | Admitting: Family Medicine

## 2016-08-19 ENCOUNTER — Ambulatory Visit (INDEPENDENT_AMBULATORY_CARE_PROVIDER_SITE_OTHER): Payer: Medicare Other | Admitting: Family Medicine

## 2016-08-19 ENCOUNTER — Encounter: Payer: Self-pay | Admitting: Family Medicine

## 2016-08-19 ENCOUNTER — Ambulatory Visit (INDEPENDENT_AMBULATORY_CARE_PROVIDER_SITE_OTHER): Payer: Medicare Other

## 2016-08-19 VITALS — BP 135/65 | HR 73 | Ht 72.0 in | Wt 154.0 lb

## 2016-08-19 DIAGNOSIS — R634 Abnormal weight loss: Secondary | ICD-10-CM | POA: Diagnosis not present

## 2016-08-19 DIAGNOSIS — Z87891 Personal history of nicotine dependence: Secondary | ICD-10-CM

## 2016-08-19 DIAGNOSIS — I700 Atherosclerosis of aorta: Secondary | ICD-10-CM

## 2016-08-19 DIAGNOSIS — J439 Emphysema, unspecified: Secondary | ICD-10-CM

## 2016-08-19 DIAGNOSIS — E039 Hypothyroidism, unspecified: Secondary | ICD-10-CM

## 2016-08-19 DIAGNOSIS — R7301 Impaired fasting glucose: Secondary | ICD-10-CM

## 2016-08-19 DIAGNOSIS — M6289 Other specified disorders of muscle: Secondary | ICD-10-CM | POA: Diagnosis not present

## 2016-08-19 LAB — POCT GLYCOSYLATED HEMOGLOBIN (HGB A1C): HEMOGLOBIN A1C: 5.7

## 2016-08-19 NOTE — Progress Notes (Signed)
Subjective:    Patient ID: Bobby Lewis, male    DOB: 10/25/1950, 66 y.o.   MRN: [id number]  HPI Impaired fasting glucose-no increased thirst or urination. No symptoms consistent with hypoglycemia.  Hypothyroidism-no recent skin or hair changes. No significant changes in weight. He reports he is taking his levothyroxin 137 g daily. Last TSH was 13.9 in June. It is been getting a little closer to normal. He is still not playing golf which he loves.   Stiffness of muscles in forearms and legs.   Review of Systems  BP 135/65   Pulse 73   Ht 6' (1.829 m)   Wt 154 lb (69.9 kg)   BMI 20.89 kg/m     No Known Allergies  Past Medical History:  Diagnosis Date  . COPD (chronic obstructive pulmonary disease) ([place])   . Genital warts    history  . Tobacco abuse     Past Surgical History:  Procedure Laterality Date  . NOSE SURGERY  2002    Social History   Social History  . Marital status: Married    Spouse name: N/A  . Number of children: N/A  . Years of education: N/A   Occupational History  . Not on file.   Social History Main Topics  . Smoking status: Former Smoker    Packs/day: 1.00    Years: 25.00    Types: Cigarettes    Quit date: 02/01/2009  . Smokeless tobacco: Never Used  . Alcohol use 1.5 oz/week    3 Standard drinks or equivalent per week     Comment: daily  . Drug use: No  . 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   Other Topics Concern  . Not on file   Social History Narrative  . No narrative on file    Family History  Problem Relation Age of Onset  . Joint hypermobility Mother   . Heart attack Father 22       died 74    Outpatient Encounter Prescriptions as of 08/19/2016  Medication Sig  . aspirin 81 MG tablet Take 81 mg by mouth daily.    Marland Kitchen atorvastatin (LIPITOR) 20 MG tablet Take 1 tablet (20 mg total) by mouth daily.  Marland Kitchen  levothyroxine (SYNTHROID, LEVOTHROID) 137 MCG tablet Take 1 tablet (137 mcg total) by mouth daily.  . Multiple Vitamin (MULTIVITAMIN PO) Take by mouth.    . Omega-3 Fatty Acids (FISH OIL) 1000 MG CAPS Take 2,000 mg by mouth daily.  Marland Kitchen omeprazole (PRILOSEC) 20 MG capsule TAKE 1 CAPSULE (20 MG TOTAL) BY MOUTH DAILY.   No facility-administered encounter medications on file as of 08/19/2016.           Objective:   Physical Exam  Constitutional: He is oriented to person, place, and time. He appears well-developed and well-nourished.  HENT:  Head: Normocephalic and atraumatic.  Cardiovascular: Normal rate, regular rhythm and normal heart sounds.   Pulmonary/Chest: Effort normal and breath sounds normal.  Neurological: He is alert and oriented to person, place, and time.  Skin: Skin is warm and dry.  Psychiatric: He has a normal mood and affect. His behavior is normal.          Assessment & Plan:  IFG - well controlled.  REpeat n 6-12 months.  Lab Results  Component Value Date   HGBA1C 5.7 08/19/2016  Hypothyroidism - TSH is still trending to normal. Though he still has some stiff muscles like he has some edema in the muscle tissues.   Muscle stiffness.  CK was normal.  Former smoker - will get CXR to rule out lung cancer since he is still losing weight.    Elevated liver enzymes have returned to normal.

## 2016-08-21 ENCOUNTER — Encounter: Payer: Self-pay | Admitting: Family Medicine

## 2016-08-22 ENCOUNTER — Telehealth: Payer: Self-pay | Admitting: Family Medicine

## 2016-08-22 NOTE — Telephone Encounter (Signed)
Call pt: I would like to refer him to Rheumatology and see what they think about his fatigue and muscle stiffness.

## 2016-08-23 NOTE — Telephone Encounter (Signed)
Left message on vm

## 2016-08-24 MED ORDER — TRIAMCINOLONE ACETONIDE 0.5 % EX OINT: 1.0000 "application " | TOPICAL_OINTMENT | Freq: Two times a day (BID) | CUTANEOUS | 0 refills | Status: DC

## 2016-08-25 ENCOUNTER — Ambulatory Visit: Payer: Medicare Other | Admitting: Family Medicine

## 2016-08-26 ENCOUNTER — Telehealth: Payer: Self-pay | Admitting: Family Medicine

## 2016-08-26 ENCOUNTER — Ambulatory Visit (INDEPENDENT_AMBULATORY_CARE_PROVIDER_SITE_OTHER): Payer: Medicare Other | Admitting: Family Medicine

## 2016-08-26 VITALS — BP 143/60 | HR 63 | Resp 18 | Wt 154.7 lb

## 2016-08-26 DIAGNOSIS — R0602 Shortness of breath: Secondary | ICD-10-CM

## 2016-08-26 DIAGNOSIS — J449 Chronic obstructive pulmonary disease, unspecified: Secondary | ICD-10-CM | POA: Diagnosis not present

## 2016-08-26 MED ORDER — ALBUTEROL SULFATE (2.5 MG/3ML) 0.083% IN NEBU
2.5000 mg | INHALATION_SOLUTION | Freq: Once | RESPIRATORY_TRACT | Status: AC
  Administered 2016-08-26: 2.5 mg via RESPIRATORY_TRACT

## 2016-08-26 MED ORDER — ALBUTEROL SULFATE HFA 108 (90 BASE) MCG/ACT IN AERS: 2.0000 | INHALATION_SPRAY | Freq: Four times a day (QID) | RESPIRATORY_TRACT | 2 refills | Status: DC | PRN

## 2016-08-26 NOTE — Progress Notes (Signed)
   Subjective:    Patient ID: Bobby Lewis, male    DOB: Aug 01, 1950, 66 y.o.   MRN: [id number]  HPI 66 year old male comes in today for spirometry. He was recently diagnosed with significant hypothyroidism and has been having a lot of trouble with muscle stiffness soreness particularly in the extremities. He's also had significant weight loss and has been having difficulty gaining weight. We decided to do a chest x-ray since he is a former smoker and he was experiencing weight loss-chest x-ray showed an emphysema  Of note he actually is feeling better. He said over the last week he's noticed he is getting less stiffness in his muscles and a little more flexibility. He actually has felt better. And he's been trying to eat consistently to keep his weight up.  Review of Systems     Objective:   Physical Exam  Constitutional: He is oriented to person, place, and time. He appears well-developed and well-nourished.  HENT:  Head: Normocephalic and atraumatic.  Cardiovascular: Normal rate, regular rhythm and normal heart sounds.   Pulmonary/Chest: Effort normal and breath sounds normal.  Neurological: He is alert and oriented to person, place, and time.  Skin: Skin is warm and dry.  Psychiatric: He has a normal mood and affect. His behavior is normal.          Assessment & Plan:  COPD, moderate.  Spirometry performed- Arlyce Harman she shows FVC 101%, FEV1 of 70% and ratio of 51%. No significant change in FEV1. Effort was felt to be good. Results consistent with moderate obstruction. I would like to start with just an as needed albuterol inhaler. Will have the pharmacist instruct him on how to use this as we do not have any samples here.

## 2016-08-26 NOTE — Telephone Encounter (Signed)
Please call patient and let him know that I forgot to let him know that I did send of her prescription for albuterol which is an inhaler to use only if needed. Sample if he gets short of breath or winded or notices any wheezing that he can do 2 puffs on the inhaler. I want him to keep track of how often he is actually using it. Have the pharmacist demonstrate and showed him how to use the device properly with good technique. Have him bring it with him at next office visit.

## 2016-08-27 ENCOUNTER — Telehealth: Payer: Self-pay | Admitting: Family Medicine

## 2016-08-27 NOTE — Telephone Encounter (Signed)
Please call the lab and see if we can resubmit codes for a B12 and folate that was drawn on 07/12/16.  See if can submit new code:  Use V12.1 personal history of nutritional deficiency 781.3 lack of coordination.

## 2016-08-27 NOTE — Telephone Encounter (Signed)
Nutritional deficiency added -EH/RMA

## 2016-08-27 NOTE — Telephone Encounter (Signed)
OK to hold off

## 2016-08-27 NOTE — Telephone Encounter (Signed)
Called Pt and his wife states that the pt does not have any breathing problems/symptoms and wants to know if he really needs it? Pt's wife states that the albuterol prescription costs $57 dollars. Pt's wife stated that she does not want to pay for it if he doesn't really need it. Please Advise?

## 2016-08-30 NOTE — Telephone Encounter (Signed)
Wife notified.

## 2016-09-10 ENCOUNTER — Other Ambulatory Visit: Payer: Self-pay | Admitting: Family Medicine

## 2016-09-13 ENCOUNTER — Other Ambulatory Visit: Payer: Self-pay

## 2016-09-13 DIAGNOSIS — E038 Other specified hypothyroidism: Secondary | ICD-10-CM

## 2016-09-27 ENCOUNTER — Encounter: Payer: Self-pay | Admitting: Family Medicine

## 2016-09-27 DIAGNOSIS — R209 Unspecified disturbances of skin sensation: Secondary | ICD-10-CM

## 2016-09-28 ENCOUNTER — Encounter: Payer: Self-pay | Admitting: Family Medicine

## 2016-09-29 DIAGNOSIS — R209 Unspecified disturbances of skin sensation: Secondary | ICD-10-CM | POA: Insufficient documentation

## 2016-09-29 NOTE — Telephone Encounter (Signed)
Please call the lab and see if they can submit diagnosis code of unspecified deficiency anemia, D53.9.   Disturbance of skin sensation,R20.9

## 2016-09-29 NOTE — Telephone Encounter (Signed)
please

## 2016-10-06 NOTE — Telephone Encounter (Signed)
Called [facility]) spoke with [facility] in [facility] billing, added new Dx codes.

## 2016-10-08 ENCOUNTER — Ambulatory Visit: Payer: Self-pay | Admitting: Family Medicine

## 2016-10-12 ENCOUNTER — Other Ambulatory Visit: Payer: Self-pay | Admitting: Family Medicine

## 2016-10-21 ENCOUNTER — Ambulatory Visit: Payer: Medicare Other | Admitting: Family Medicine

## 2016-10-28 DIAGNOSIS — E038 Other specified hypothyroidism: Secondary | ICD-10-CM | POA: Diagnosis not present

## 2016-10-28 LAB — TSH: TSH: 0.57 m[IU]/L (ref 0.40–4.50)

## 2016-10-29 ENCOUNTER — Other Ambulatory Visit: Payer: Self-pay

## 2016-10-29 DIAGNOSIS — E039 Hypothyroidism, unspecified: Secondary | ICD-10-CM

## 2016-11-01 ENCOUNTER — Ambulatory Visit (INDEPENDENT_AMBULATORY_CARE_PROVIDER_SITE_OTHER): Payer: Medicare Other | Admitting: Family Medicine

## 2016-11-01 ENCOUNTER — Encounter: Payer: Self-pay | Admitting: Family Medicine

## 2016-11-01 VITALS — BP 136/70 | HR 72 | Ht 72.0 in | Wt 153.0 lb

## 2016-11-01 DIAGNOSIS — E041 Nontoxic single thyroid nodule: Secondary | ICD-10-CM | POA: Diagnosis not present

## 2016-11-01 DIAGNOSIS — J439 Emphysema, unspecified: Secondary | ICD-10-CM | POA: Diagnosis not present

## 2016-11-01 DIAGNOSIS — R634 Abnormal weight loss: Secondary | ICD-10-CM | POA: Diagnosis not present

## 2016-11-01 DIAGNOSIS — E039 Hypothyroidism, unspecified: Secondary | ICD-10-CM

## 2016-11-01 DIAGNOSIS — I700 Atherosclerosis of aorta: Secondary | ICD-10-CM | POA: Diagnosis not present

## 2016-11-01 DIAGNOSIS — N62 Hypertrophy of breast: Secondary | ICD-10-CM | POA: Diagnosis not present

## 2016-11-01 NOTE — Progress Notes (Signed)
Subjective:    Patient ID: Bobby Lewis, male    DOB: 24-Aug-1950, 66 y.o.   MRN: [id number]  HPI 66 yo male comes in complaining of persistent swelling and firmness over the nipples bilaterally. He says it's worse at times.  Stopped his omeprazole about 3 weeks ago. But no improvement in his symptoms. Stopped the steroid cream and using Lubriderm for his skin.  Wife wonders if could be the thyroid medication.  He doesn't smoke pot or drink alcohol.     Hypothyroid - he is back to yoga and playing golf again. Last TSH looks great. Affect is just a little bit less than one third of feeling in a keep an eye on this and make sure that we are making him hyperthyroid.   Review of Systems   BP 136/70   Pulse 72   Ht 6' (1.829 m)   Wt 153 lb (69.4 kg)   SpO2 100%   BMI 20.75 kg/m     No Known Allergies  Past Medical History:  Diagnosis Date  . COPD (chronic obstructive pulmonary disease) ([place])   . Genital warts    history  . Tobacco abuse     Past Surgical History:  Procedure Laterality Date  . NOSE SURGERY  2002    Social History   Social History  . Marital status: Married    Spouse name: N/A  . Number of children: N/A  . Years of education: N/A   Occupational History  . Not on file.   Social History Main Topics  . Smoking status: Former Smoker    Packs/day: 1.00    Years: 25.00    Types: Cigarettes    Quit date: 02/01/2009  . Smokeless tobacco: Never Used  . Alcohol use 1.5 oz/week    3 Standard drinks or equivalent per week     Comment: daily  . Drug use: No  . 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   Other Topics Concern  . Not on file   Social History Narrative  . No narrative on file    Family History  Problem Relation Age of Onset  . Joint hypermobility Mother   . Heart attack Father 12       died 59    Outpatient Encounter  Prescriptions as of 11/01/2016  Medication Sig  . albuterol (PROVENTIL HFA;VENTOLIN HFA) 108 (90 Base) MCG/ACT inhaler Inhale 2 puffs into the lungs every 6 (six) hours as needed for wheezing or shortness of breath.  Marland Kitchen aspirin 81 MG tablet Take 81 mg by mouth daily.    Marland Kitchen atorvastatin (LIPITOR) 20 MG tablet Take 1 tablet (20 mg total) by mouth daily.  Marland Kitchen latanoprost (XALATAN) 0.005 % ophthalmic solution Place 1 drop into both eyes at bedtime.  Marland Kitchen levothyroxine (SYNTHROID, LEVOTHROID) 137 MCG tablet TAKE 1 TABLET (137 MCG TOTAL) BY MOUTH DAILY.  . Multiple Vitamin (MULTIVITAMIN PO) Take by mouth.    . Omega-3 Fatty Acids (FISH OIL) 1000 MG CAPS Take 2,000 mg by mouth daily.  Marland Kitchen triamcinolone ointment (KENALOG) 0.5 % Apply 1 application topically 2 (two) times daily.  . [DISCONTINUED] omeprazole (PRILOSEC) 20 MG capsule TAKE 1 CAPSULE (20 MG TOTAL) BY MOUTH DAILY.   No facility-administered encounter medications on file as of 11/01/2016.           Objective:   Physical  Exam  Constitutional: He is oriented to person, place, and time. He appears well-developed and well-nourished.  HENT:  Head: Normocephalic and atraumatic.  Eyes: Conjunctivae and EOM are normal.  Cardiovascular: Normal rate.   Pulmonary/Chest: Effort normal.  Firm nodule underneath both nipples that are tender to touch.  Neurological: He is alert and oriented to person, place, and time.  Skin: Skin is dry. No pallor.  Psychiatric: He has a normal mood and affect. His behavior is normal.  Vitals reviewed.      Assessment & Plan:  Rolan Bucco this point since he has not had improvement off of the omeprazole will do some additional labs including testosterone, LH, hCG and estradiol. We'll also get imaging including a mammogram and possibly ultrasound. Both borders place. Will call for results once available.  Hypothyroidism-finely his TSH is within the normal range. Will check again in about 2-3 months just to make sure  that the TSH doesn't drop too low. If exciting to see that he is back to his regular activities that he normally enjoys.

## 2016-11-05 ENCOUNTER — Other Ambulatory Visit: Payer: Self-pay

## 2016-11-05 MED ORDER — LEVOTHYROXINE SODIUM 137 MCG PO TABS: 137.0000 ug | ORAL_TABLET | Freq: Every day | ORAL | 0 refills | Status: DC

## 2016-11-05 NOTE — Telephone Encounter (Signed)
Patient request refill for Levothyroxine 137 mcg. Patient was advised that he needs labs done. Rhonda Cunningham,CMA

## 2016-11-08 LAB — TESTOSTERONE TOTAL,FREE,BIO, MALES
ALBUMIN MSPROF: 4 g/dL (ref 3.6–5.1)
SEX HORMONE BINDING: 66 nmol/L (ref 22–77)
TESTOSTERONE FREE: 32 pg/mL — AB (ref 46.0–224.0)
TESTOSTERONE: 439 ng/dL (ref 250–827)
Testosterone, Bioavailable: 58.8 ng/dL — ABNORMAL LOW (ref >=110.0)

## 2016-11-08 LAB — ESTRADIOL: ESTRADIOL: 21 pg/mL (ref <=39)

## 2016-11-08 LAB — LUTEINIZING HORMONE: LH: 5.3 m[IU]/mL (ref 1.6–15.2)

## 2016-11-08 LAB — HCG, TOTAL, QUANTITATIVE

## 2016-11-10 ENCOUNTER — Other Ambulatory Visit: Payer: Medicare Other

## 2016-11-11 ENCOUNTER — Ambulatory Visit: Payer: Medicare Other

## 2016-11-11 DIAGNOSIS — Z23 Encounter for immunization: Secondary | ICD-10-CM | POA: Diagnosis not present

## 2016-11-15 ENCOUNTER — Ambulatory Visit
Admission: RE | Admit: 2016-11-15 | Discharge: 2016-11-15 | Disposition: A | Discharge status: Home / Self Care | Payer: Medicare Other | Source: Ambulatory Visit | Attending: Family Medicine | Admitting: Family Medicine

## 2016-11-15 ENCOUNTER — Ambulatory Visit: Payer: Medicare Other

## 2016-11-15 DIAGNOSIS — N62 Hypertrophy of breast: Secondary | ICD-10-CM

## 2016-11-15 DIAGNOSIS — R928 Other abnormal and inconclusive findings on diagnostic imaging of breast: Secondary | ICD-10-CM | POA: Diagnosis not present

## 2016-11-18 ENCOUNTER — Ambulatory Visit: Payer: Self-pay | Admitting: Family Medicine

## 2016-12-02 ENCOUNTER — Other Ambulatory Visit: Payer: Self-pay | Admitting: *Deleted

## 2016-12-02 MED ORDER — LEVOTHYROXINE SODIUM 137 MCG PO TABS: 137.0000 ug | ORAL_TABLET | Freq: Every day | ORAL | 1 refills | Status: DC

## 2016-12-02 MED ORDER — OMEPRAZOLE 20 MG PO CPDR: 20.0000 mg | DELAYED_RELEASE_CAPSULE | Freq: Every day | ORAL | 3 refills | Status: DC

## 2016-12-03 ENCOUNTER — Other Ambulatory Visit: Payer: Self-pay | Admitting: Family Medicine

## 2016-12-03 ENCOUNTER — Other Ambulatory Visit: Payer: Self-pay | Admitting: *Deleted

## 2016-12-05 ENCOUNTER — Other Ambulatory Visit: Payer: Self-pay | Admitting: Family Medicine

## 2017-01-05 ENCOUNTER — Encounter: Payer: Self-pay | Admitting: Family Medicine

## 2017-01-05 ENCOUNTER — Ambulatory Visit (INDEPENDENT_AMBULATORY_CARE_PROVIDER_SITE_OTHER): Payer: Medicare Other | Admitting: Family Medicine

## 2017-01-05 VITALS — BP 137/53 | HR 67 | Temp 97.8°F | Ht 72.0 in | Wt 155.0 lb

## 2017-01-05 DIAGNOSIS — N62 Hypertrophy of breast: Secondary | ICD-10-CM

## 2017-01-05 DIAGNOSIS — E039 Hypothyroidism, unspecified: Secondary | ICD-10-CM

## 2017-01-05 DIAGNOSIS — H65191 Other acute nonsuppurative otitis media, right ear: Secondary | ICD-10-CM | POA: Diagnosis not present

## 2017-01-05 LAB — TSH: TSH: 2.58 m[IU]/L (ref 0.40–4.50)

## 2017-01-05 MED ORDER — PREDNISONE 20 MG PO TABS: 40.0000 mg | ORAL_TABLET | Freq: Every day | ORAL | 0 refills | Status: DC

## 2017-01-05 NOTE — Progress Notes (Signed)
Subjective:    Patient ID: Bobby Lewis, male    DOB: [birth year], 66 y.o.   MRN: [id number]  HPI 72 you male c/o of cough x 2.5 weeks.  Denies any fevers chills or sweats.  Currently taking Advil cold and cough.  Has him hearing loss that he complains of as well that he feels is worse on the left compared to the right.  Hypothyroidism-taking medication regularly.  He does take an extra half a tab 1 day a week.  He is due for repeat TSH today.  He still expensing some gynecomastia bilaterally.  We did some additional blood work just to rule out hormonal causes and even did a mammogram on October 15 which was normal.  At this point I am not clear what is causing it.   Review of Systems  BP (!) 137/53   Pulse 67   Temp 97.8 F (36.6 C)   Ht 6' (1.829 m)   Wt 155 lb (70.3 kg)   SpO2 100%   BMI 21.02 kg/m     No Known Allergies  Past Medical History:  Diagnosis Date  . COPD (chronic obstructive pulmonary disease) ([place])   . Genital warts    history  . Tobacco abuse     Past Surgical History:  Procedure Laterality Date  . NOSE SURGERY  2002    Social History   Socioeconomic History  . Marital status: Married    Spouse name: Not on file  . Number of children: Not on file  . Years of education: Not on file  . Highest education level: Not on file  Social Needs  . Financial resource strain: Not on file  . Food insecurity - worry: Not on file  . Food insecurity - inability: Not on file  . Transportation needs - medical: Not on file  . Transportation needs - non-medical: Not on file  Occupational History  . Not on file  Tobacco Use  . Smoking status: Former Smoker    Packs/day: 1.00    Years: 25.00    Pack years: 25.00    Types: Cigarettes    Last attempt to quit: 02/01/2009    Years since quitting: 7.9  . Smokeless tobacco: Never Used  Substance and Sexual Activity  . Alcohol use: Yes    Alcohol/week: 1.5 oz    Types: 3 Standard drinks or equivalent per week     Comment: daily  . Drug use: No  . 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  Other Topics Concern  . Not on file  Social History Narrative  . Not on file    Family History  Problem Relation Age of Onset  . Joint hypermobility Mother   . Heart attack Father 90       died 31    Outpatient Encounter Medications as of 01/05/2017  Medication Sig  . aspirin 81 MG tablet Take 81 mg by mouth daily.    Marland Kitchen atorvastatin (LIPITOR) 20 MG tablet Take 1 tablet (20 mg total) by mouth daily.  Marland Kitchen latanoprost (XALATAN) 0.005 % ophthalmic solution Place 1 drop into both eyes at bedtime.  Marland Kitchen levothyroxine (SYNTHROID, LEVOTHROID) 137 MCG tablet Take 1 tablet (137 mcg total) by mouth daily.  . Multiple Vitamin (MULTIVITAMIN PO) Take by mouth.    . Omega-3 Fatty Acids (FISH OIL) 1000 MG CAPS Take 2,000  mg by mouth daily.  . predniSONE (DELTASONE) 20 MG tablet Take 2 tablets (40 mg total) by mouth daily.  . [DISCONTINUED] albuterol (PROVENTIL HFA;VENTOLIN HFA) 108 (90 Base) MCG/ACT inhaler Inhale 2 puffs into the lungs every 6 (six) hours as needed for wheezing or shortness of breath.  . [DISCONTINUED] omeprazole (PRILOSEC) 20 MG capsule Take 1 capsule (20 mg total) by mouth daily.  . [DISCONTINUED] omeprazole (PRILOSEC) 20 MG capsule TAKE 1 CAPSULE (20 MG TOTAL) BY MOUTH DAILY.  . [DISCONTINUED] triamcinolone ointment (KENALOG) 0.5 % Apply 1 application topically 2 (two) times daily.   No facility-administered encounter medications on file as of 01/05/2017.          Objective:   Physical Exam  Constitutional: He is oriented to person, place, and time. He appears well-developed and well-nourished.  HENT:  Head: Normocephalic and atraumatic.  Right Ear: External ear normal.  Left Ear: External ear normal.  Nose: Nose normal.  Mouth/Throat: Oropharynx is clear and moist.  TMs and canals  are clear.   Eyes: Conjunctivae and EOM are normal. Pupils are equal, round, and reactive to light.  Neck: Neck supple. No thyromegaly present.  Cardiovascular: Normal rate and normal heart sounds.  Pulmonary/Chest: Effort normal and breath sounds normal.  He has firm small masses under both breasts.   Lymphadenopathy:    He has no cervical adenopathy.  Neurological: He is alert and oriented to person, place, and time.  Skin: Skin is warm and dry.  Psychiatric: He has a normal mood and affect.          Assessment & Plan:  Right ear effusion with bronchitis-at this point would like to put him on about 5 days of prednisone just to see if we can get his ears to get some pressure relief and open up.  I do not see any sign of infection so not got him on an antibiotic.  If he is not significantly better after 1 week then please let us know.  He feels like he is getting worse or developing a fever then please let us know.  Hypothyroid is him-due to recheck TSH today.  Gynecomastia-I will reach out to endocrinology and see if they have any thoughts on why he might have gynecomastia.

## 2017-01-07 ENCOUNTER — Telehealth: Payer: Self-pay | Admitting: Family Medicine

## 2017-01-07 NOTE — Telephone Encounter (Signed)
Pt informed.Bobby Lewis Lynetta  

## 2017-01-07 NOTE — Telephone Encounter (Signed)
Please call Vidit and let him know I did consult with 1 of our endocrinologist and he says that Synthroid actually can cause gynecomastia he but he says it usually does eventually go away so at this point I think we will just continue to monitor and hopefully over the next 3-6 months it will get better.  Beatrice Lecher, MD

## 2017-01-07 NOTE — Telephone Encounter (Signed)
-----   Message from Renato Shin, MD sent at 01/06/2017  8:01 PM EST ----- Regarding: RE: Need your thoughts on a question.  Contact: [phone] Zackery Barefoot, starting synthroid can cause this.  If this is the cause, it will get better with time.  I hope this helps.  Hilliard Clark    ----- Message ----- From: Hali Marry, MD Sent: 01/05/2017   1:22 PM To: Renato Shin, MD Subject: Need your thoughts on a question.              I have a 66 yo male recently dx with hypothyroidism and started on medication who has started to develop gynecomastia with palpable breast "buds".  Have you ever seen this happen? I have done some labs and even a mammogram and all is normal.  Any thoughts?  Thank you for your help! Beatrice Lecher MD

## 2017-06-12 ENCOUNTER — Other Ambulatory Visit: Payer: Self-pay | Admitting: Family Medicine

## 2017-06-12 ENCOUNTER — Encounter: Payer: Self-pay | Admitting: Family Medicine

## 2017-06-12 DIAGNOSIS — E039 Hypothyroidism, unspecified: Secondary | ICD-10-CM

## 2017-06-13 MED ORDER — LEVOTHYROXINE SODIUM 137 MCG PO TABS: 137.0000 ug | ORAL_TABLET | Freq: Every day | ORAL | 2 refills | Status: DC

## 2017-07-11 ENCOUNTER — Telehealth: Payer: Self-pay | Admitting: Family Medicine

## 2017-07-11 DIAGNOSIS — E781 Pure hyperglyceridemia: Secondary | ICD-10-CM

## 2017-07-11 DIAGNOSIS — E611 Iron deficiency: Secondary | ICD-10-CM

## 2017-07-11 DIAGNOSIS — R748 Abnormal levels of other serum enzymes: Secondary | ICD-10-CM

## 2017-07-11 DIAGNOSIS — R7301 Impaired fasting glucose: Secondary | ICD-10-CM

## 2017-07-11 DIAGNOSIS — E039 Hypothyroidism, unspecified: Secondary | ICD-10-CM

## 2017-07-11 NOTE — Telephone Encounter (Signed)
Order cancelled.Bobby Lewis, Bobby Lewis

## 2017-07-11 NOTE — Telephone Encounter (Signed)
Routing to PCP for review.

## 2017-07-11 NOTE — Telephone Encounter (Signed)
Labs ordered and faxed, lvm informing pt.Bobby Lewis, [place]

## 2017-07-11 NOTE — Telephone Encounter (Signed)
Pt called and scheduled his yearly CPE for this month and patient is requesting that we send his lab order to lab so he can have those drawn before his appt

## 2017-07-11 NOTE — Addendum Note (Signed)
Addended by: Teddy Spike on: 07/11/2017 05:05 PM   Modules accepted: Orders

## 2017-07-11 NOTE — Telephone Encounter (Signed)
Please cancel the heptic func

## 2017-07-20 DIAGNOSIS — E039 Hypothyroidism, unspecified: Secondary | ICD-10-CM | POA: Diagnosis not present

## 2017-07-20 DIAGNOSIS — E611 Iron deficiency: Secondary | ICD-10-CM | POA: Diagnosis not present

## 2017-07-20 DIAGNOSIS — E781 Pure hyperglyceridemia: Secondary | ICD-10-CM | POA: Diagnosis not present

## 2017-07-20 DIAGNOSIS — R7301 Impaired fasting glucose: Secondary | ICD-10-CM | POA: Diagnosis not present

## 2017-07-20 DIAGNOSIS — R748 Abnormal levels of other serum enzymes: Secondary | ICD-10-CM | POA: Diagnosis not present

## 2017-07-27 ENCOUNTER — Ambulatory Visit (INDEPENDENT_AMBULATORY_CARE_PROVIDER_SITE_OTHER): Payer: Medicare Other | Admitting: Family Medicine

## 2017-07-27 ENCOUNTER — Encounter: Payer: Self-pay | Admitting: Family Medicine

## 2017-07-27 VITALS — BP 125/65 | HR 65 | Ht 72.0 in | Wt 158.0 lb

## 2017-07-27 DIAGNOSIS — H259 Unspecified age-related cataract: Secondary | ICD-10-CM | POA: Insufficient documentation

## 2017-07-27 DIAGNOSIS — Z0000 Encounter for general adult medical examination without abnormal findings: Secondary | ICD-10-CM | POA: Diagnosis not present

## 2017-07-27 DIAGNOSIS — Z23 Encounter for immunization: Secondary | ICD-10-CM | POA: Diagnosis not present

## 2017-07-27 NOTE — Progress Notes (Deleted)
Subjective:    Patient ID: Bobby Lewis, male    DOB: 16-Jul-1950, 67 y.o.   MRN: [id number]  HPI 67 year old male is here today for complete physical exam.   Review of Systems Conference of review of systems is negative except for HPI.  There were no vitals taken for this visit.    No Known Allergies  Past Medical History:  Diagnosis Date  . COPD (chronic obstructive pulmonary disease) ([place])   . Genital warts    history  . Tobacco abuse     Past Surgical History:  Procedure Laterality Date  . NOSE SURGERY  2002    Social History   Socioeconomic History  . Marital status: Married    Spouse name: Not on file  . Number of children: Not on file  . Years of education: Not on file  . Highest education level: Not on file  Occupational History  . Not on file  Social Needs  . Financial resource strain: Not on file  . Food insecurity:    Worry: Not on file    Inability: Not on file  . Transportation needs:    Medical: Not on file    Non-medical: Not on file  Tobacco Use  . Smoking status: Former Smoker    Packs/day: 1.00    Years: 25.00    Pack years: 25.00    Types: Cigarettes    Last attempt to quit: 02/01/2009    Years since quitting: 8.4  . Smokeless tobacco: Never Used  Substance and Sexual Activity  . Alcohol use: Yes    Alcohol/week: 1.8 oz    Types: 3 Standard drinks or equivalent per week    Comment: daily  . Drug use: No  . 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  Lifestyle  . Physical activity:    Days per week: Not on file    Minutes per session: Not on file  . Stress: Not on file  Relationships  . Social connections:    Talks on phone: Not on file    Gets together: Not on file    Attends religious service: Not on file    Active member of club or organization: Not on file    Attends meetings of clubs or organizations: Not on  file    Relationship status: Not on file  . Intimate partner violence:    Fear of current or ex partner: Not on file    Emotionally abused: Not on file    Physically abused: Not on file    Forced sexual activity: Not on file  Other Topics Concern  . Not on file  Social History Narrative  . Not on file    Family History  Problem Relation Age of Onset  . Joint hypermobility Mother   . Heart attack Father 86       died 49    Outpatient Encounter Medications as of 07/27/2017  Medication Sig  . aspirin 81 MG tablet Take 81 mg by mouth daily.    Marland Kitchen atorvastatin (LIPITOR) 20 MG tablet TAKE ONE TABLET BY MOUTH DAILY.  Marland Kitchen latanoprost (XALATAN) 0.005 % ophthalmic solution Place 1 drop into both eyes at bedtime.  Marland Kitchen levothyroxine (SYNTHROID, LEVOTHROID) 137 MCG tablet Take 1 tablet (137 mcg total) by mouth daily.  . Multiple Vitamin (MULTIVITAMIN PO) Take by mouth.    . Omega-3  Fatty Acids (FISH OIL) 1000 MG CAPS Take 2,000 mg by mouth daily.  . predniSONE (DELTASONE) 20 MG tablet Take 2 tablets (40 mg total) by mouth daily.   No facility-administered encounter medications on file as of 07/27/2017.          Objective:   Physical Exam  Constitutional: He is oriented to person, place, and time. He appears well-developed and well-nourished.  HENT:  Head: Normocephalic and atraumatic.  Right Ear: External ear normal.  Left Ear: External ear normal.  Nose: Nose normal.  Mouth/Throat: Oropharynx is clear and moist.  Eyes: Pupils are equal, round, and reactive to light. Conjunctivae and EOM are normal.  Neck: Normal range of motion. Neck supple. No thyromegaly present.  Cardiovascular: Normal rate, regular rhythm, normal heart sounds and intact distal pulses.  Pulmonary/Chest: Effort normal and breath sounds normal.  Abdominal: Soft. Bowel sounds are normal. He exhibits no distension and no mass. There is no tenderness. There is no rebound and no guarding.  Musculoskeletal: Normal range of  motion.  Lymphadenopathy:    He has no cervical adenopathy.  Neurological: He is alert and oriented to person, place, and time. He has normal reflexes.  Skin: Skin is warm and dry.  Psychiatric: He has a normal mood and affect. His behavior is normal. Judgment and thought content normal.          Assessment & Plan:  Keep up a regular exercise program and make sure you are eating a healthy diet Try to eat 4 servings of dairy a day, or if you are lactose intolerant take a calcium with vitamin D daily.  Your vaccines are up to date.

## 2017-07-27 NOTE — Progress Notes (Signed)
Subjective:   Bobby Lewis is a 67 y.o. male who presents for Medicare Annual/Subsequent preventive examination.  He is doing well overall.  He is back to his normal activities.  He does yoga at home daily and that actually goes to class once a week.  He has been golfing at least 2 days/week.  He still complains he has a lot of stiffness and hardness in his muscles but overall feels well.  He is actually been trying to eat breakfast occasionally.  He is here today with his wife.  He does have cataracts but they are not quite ready for surgical repair.  Review of Systems:  Comprehensive ROS is negative.         Objective:    Vitals: BP 125/65   Pulse 65   Ht 6' (1.829 m)   Wt 158 lb (71.7 kg)   SpO2 100%   BMI 21.43 kg/m   Body mass index is 21.43 kg/m.  Advanced Directives 07/27/2017 09/17/2014  Does Patient Have a Medical Advance Directive? Yes Yes  Type of Paramedic of [place];Living will [place];Living will  Copy of [place] in Chart? Yes No - copy requested    Tobacco Social History   Tobacco Use  Smoking Status Former Smoker  . Packs/day: 1.00  . Years: 25.00  . Pack years: 25.00  . Types: Cigarettes  . Last attempt to quit: 02/01/2009  . Years since quitting: 8.4  Smokeless Tobacco Never Used     Counseling given: Not Answered   Clinical Intake:    Physical Exam  Constitutional: He is oriented to person, place, and time. He appears well-developed and well-nourished.  HENT:  Head: Normocephalic and atraumatic.  Right Ear: External ear normal.  Left Ear: External ear normal.  Nose: Nose normal.  Mouth/Throat: Oropharynx is clear and moist.  Eyes: Pupils are equal, round, and reactive to light. Conjunctivae and EOM are normal.  Neck: Normal range of motion. Neck supple. No thyromegaly present.  Cardiovascular: Normal rate, regular rhythm, normal heart sounds and intact distal pulses.   Pulmonary/Chest: Effort normal and breath sounds normal.  Abdominal: Soft. Bowel sounds are normal. He exhibits no distension and no mass. There is no tenderness. There is no rebound and no guarding.  Musculoskeletal: Normal range of motion.  Lymphadenopathy:    He has no cervical adenopathy.  Neurological: He is alert and oriented to person, place, and time. He has normal reflexes.  Skin: Skin is warm and dry.  Psychiatric: He has a normal mood and affect. His behavior is normal. Judgment and thought content normal.                  Past Medical History:  Diagnosis Date  . COPD (chronic obstructive pulmonary disease) ([place])   . Genital warts    history  . Tobacco abuse    Past Surgical History:  Procedure Laterality Date  . NOSE SURGERY  2002   Family History  Problem Relation Age of Onset  . Joint hypermobility Mother   . Heart attack Father 24       died 37   Social History   Socioeconomic History  . Marital status: Married    Spouse name: Not on file  . Number of children: Not on file  . Years of education: Not on file  . Highest education level: Not on file  Occupational History  . Not on file  Social Needs  .  Financial resource strain: Not on file  . Food insecurity:    Worry: Not on file    Inability: Not on file  . Transportation needs:    Medical: Not on file    Non-medical: Not on file  Tobacco Use  . Smoking status: Former Smoker    Packs/day: 1.00    Years: 25.00    Pack years: 25.00    Types: Cigarettes    Last attempt to quit: 02/01/2009    Years since quitting: 8.4  . Smokeless tobacco: Never Used  Substance and Sexual Activity  . Alcohol use: Yes    Alcohol/week: 1.8 oz    Types: 3 Standard drinks or equivalent per week    Comment: daily  . Drug use: No  . 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  Lifestyle  . Physical  activity:    Days per week: Not on file    Minutes per session: Not on file  . Stress: Not on file  Relationships  . Social connections:    Talks on phone: Not on file    Gets together: Not on file    Attends religious service: Not on file    Active member of club or organization: Not on file    Attends meetings of clubs or organizations: Not on file    Relationship status: Not on file  Other Topics Concern  . Not on file  Social History Narrative  . Not on file    Outpatient Encounter Medications as of 07/27/2017  Medication Sig  . aspirin 81 MG tablet Take 81 mg by mouth daily.    Marland Kitchen atorvastatin (LIPITOR) 20 MG tablet TAKE ONE TABLET BY MOUTH DAILY.  Marland Kitchen latanoprost (XALATAN) 0.005 % ophthalmic solution Place 1 drop into both eyes at bedtime.  Marland Kitchen levothyroxine (SYNTHROID, LEVOTHROID) 137 MCG tablet Take 1 tablet (137 mcg total) by mouth daily.  . Multiple Vitamin (MULTIVITAMIN PO) Take by mouth.    . Omega-3 Fatty Acids (FISH OIL) 1000 MG CAPS Take 2,000 mg by mouth daily.  . [DISCONTINUED] predniSONE (DELTASONE) 20 MG tablet Take 2 tablets (40 mg total) by mouth daily.   No facility-administered encounter medications on file as of 07/27/2017.     Activities of Daily Living In your present state of health, do you have any difficulty performing the following activities: 07/27/2017  Hearing? N  Vision? N  Difficulty concentrating or making decisions? N  Walking or climbing stairs? N  Dressing or bathing? N  Doing errands, shopping? N  Some recent data might be hidden    Patient Care Team: Hali Marry, MD as PCP - General (Family Medicine)   Assessment:   This is a routine wellness examination for Bobby Lewis.  Exercise Activities and Dietary recommendations Current Exercise Habits: Structured exercise class(also plays golf), Type of exercise: yoga, Frequency (Times/Week): 6  Goals    None      Fall Risk Fall Risk  07/27/2017 05/25/2016  Falls in the past year? No  No    Depression Screen PHQ 2/9 Scores 07/27/2017 05/25/2016  PHQ - 2 Score 0 0    Cognitive Function     6CIT Screen 07/27/2017  What Year? 0 points  What month? 0 points  What time? 0 points  Count back from 20 0 points  Months in reverse 0 points  Repeat phrase 0 points  Total Score 0    Immunization History  Administered Date(s) Administered  . H1N1 01/31/2008  . Influenza Split 10/18/2011  . Influenza Whole 01/31/2008, 12/25/2008  . Influenza, High Dose Seasonal PF 11/11/2016  . Influenza,inj,Quad PF,6+ Mos 11/09/2012, 11/01/2013, 09/17/2014, 09/18/2015  . Pneumococcal Conjugate-13 07/14/2016  . Pneumococcal Polysaccharide-23 07/27/2017  . Td 03/05/2002  . Tdap 08/15/2012  . Zoster 10/18/2011    Qualifies for Shingles Vaccine? Yes, info provided.   Screening Tests Health Maintenance  Topic Date Due  . PNA vac Low Risk Adult (2 of 2 - PPSV23) 07/14/2017  . INFLUENZA VACCINE  09/01/2017  . COLONOSCOPY  03/29/2019  . TETANUS/TDAP  08/16/2022  . Hepatitis C Screening  Completed   Cancer Screenings: Lung: Low Dose CT Chest recommended if Age 38-80 years, 30 pack-year currently smoking OR have quit w/in 15years. Patient does not qualify. Colorectal: Up to date. 2016.   Additional Screenings: Hepatitis C Screening:      Plan:    Medicare Wellness   I have personally reviewed and noted the following in the patient's chart:   . Medical and social history . Use of alcohol, tobacco or illicit drugs  . Current medications and supplements . Functional ability and status . Nutritional status . Physical activity -keep up the good work. . Advanced directives . List of other physicians . Hospitalizations, surgeries, and ER visits in previous 12 months . Vitals . Screenings to include cognitive, depression, and falls - negative . Referrals and appointments . Hypothyroid is him-go back to a whole tab daily and plan to recheck level again in 6 to 8 weeks.  In  addition, I have reviewed and discussed with patient certain preventive protocols, quality metrics, and best practice recommendations. A written personalized care plan for preventive services as well as general preventive health recommendations were provided to patient.     Beatrice Lecher, MD  07/27/2017

## 2017-07-27 NOTE — Patient Instructions (Signed)

## 2017-11-01 LAB — COMPLETE METABOLIC PANEL WITH GFR
AG RATIO: 1.8 (calc) (ref 1.0–2.5)
ALBUMIN MSPROF: 4.2 g/dL (ref 3.6–5.1)
ALKALINE PHOSPHATASE (APISO): 95 U/L (ref 40–115)
ALT: 15 U/L (ref 9–46)
AST: 16 U/L (ref 10–35)
BILIRUBIN TOTAL: 0.6 mg/dL (ref 0.2–1.2)
BUN: 13 mg/dL (ref 7–25)
CO2: 25 mmol/L (ref 20–32)
Calcium: 9.4 mg/dL (ref 8.6–10.3)
Chloride: 109 mmol/L (ref 98–110)
Creat: 0.81 mg/dL (ref 0.70–1.25)
GFR, EST AFRICAN AMERICAN: 107 mL/min/{1.73_m2} (ref >=60)
GFR, EST NON AFRICAN AMERICAN: 93 mL/min/{1.73_m2} (ref >=60)
GLOBULIN: 2.3 g/dL (ref 1.9–3.7)
Glucose, Bld: 101 mg/dL — ABNORMAL HIGH (ref 65–99)
Potassium: 4.5 mmol/L (ref 3.5–5.3)
Sodium: 141 mmol/L (ref 135–146)
Total Protein: 6.5 g/dL (ref 6.1–8.1)

## 2017-11-01 LAB — IRON,TIBC AND FERRITIN PANEL
%SAT: 22 % (calc) (ref 20–48)
Ferritin: 38 ng/mL (ref 24–380)
Iron: 63 ug/dL (ref 50–180)
TIBC: 293 ug/dL (ref 250–425)

## 2017-11-01 LAB — LIPID PANEL
Cholesterol: 117 mg/dL (ref <200)
HDL: 38 mg/dL — ABNORMAL LOW (ref >40)
LDL CHOLESTEROL (CALC): 64 mg/dL
NON-HDL CHOLESTEROL (CALC): 79 mg/dL (ref <130)
TRIGLYCERIDES: 72 mg/dL (ref <150)
Total CHOL/HDL Ratio: 3.1 (calc) (ref <5.0)

## 2017-11-01 LAB — CBC
HCT: 39.5 % (ref 38.5–50.0)
Hemoglobin: 13.4 g/dL (ref 13.2–17.1)
MCH: 30 pg (ref 27.0–33.0)
MCHC: 33.9 g/dL (ref 32.0–36.0)
MCV: 88.6 fL (ref 80.0–100.0)
MPV: 10.6 fL (ref 7.5–12.5)
Platelets: 265 10*3/uL (ref 140–400)
RBC: 4.46 10*6/uL (ref 4.20–5.80)
RDW: 13.1 % (ref 11.0–15.0)
WBC: 6 10*3/uL (ref 3.8–10.8)

## 2017-11-01 LAB — TSH: TSH: 0.35 mIU/L — ABNORMAL LOW (ref 0.40–4.50)

## 2017-11-01 LAB — HEMOGLOBIN A1C
Hgb A1c MFr Bld: 5.7 % of total Hgb — ABNORMAL HIGH (ref <5.7)
Mean Plasma Glucose: 117 (calc)
eAG (mmol/L): 6.5 (calc)

## 2017-12-09 ENCOUNTER — Other Ambulatory Visit: Payer: Self-pay | Admitting: Family Medicine

## 2017-12-15 ENCOUNTER — Ambulatory Visit (INDEPENDENT_AMBULATORY_CARE_PROVIDER_SITE_OTHER): Payer: Medicare Other | Admitting: Family Medicine

## 2017-12-15 DIAGNOSIS — Z23 Encounter for immunization: Secondary | ICD-10-CM

## 2017-12-21 DIAGNOSIS — H527 Unspecified disorder of refraction: Secondary | ICD-10-CM | POA: Diagnosis not present

## 2017-12-21 DIAGNOSIS — H52223 Regular astigmatism, bilateral: Secondary | ICD-10-CM | POA: Diagnosis not present

## 2017-12-21 DIAGNOSIS — H40003 Preglaucoma, unspecified, bilateral: Secondary | ICD-10-CM | POA: Diagnosis not present

## 2017-12-21 DIAGNOSIS — H25813 Combined forms of age-related cataract, bilateral: Secondary | ICD-10-CM | POA: Diagnosis not present

## 2018-02-17 DIAGNOSIS — H25813 Combined forms of age-related cataract, bilateral: Secondary | ICD-10-CM | POA: Diagnosis not present

## 2018-02-17 DIAGNOSIS — H52223 Regular astigmatism, bilateral: Secondary | ICD-10-CM | POA: Diagnosis not present

## 2018-02-23 DIAGNOSIS — H25811 Combined forms of age-related cataract, right eye: Secondary | ICD-10-CM | POA: Diagnosis not present

## 2018-02-23 DIAGNOSIS — H25813 Combined forms of age-related cataract, bilateral: Secondary | ICD-10-CM | POA: Diagnosis not present

## 2018-02-23 DIAGNOSIS — Z87891 Personal history of nicotine dependence: Secondary | ICD-10-CM | POA: Diagnosis not present

## 2018-02-23 DIAGNOSIS — E039 Hypothyroidism, unspecified: Secondary | ICD-10-CM | POA: Diagnosis not present

## 2018-02-23 DIAGNOSIS — Z888 Allergy status to other drugs, medicaments and biological substances status: Secondary | ICD-10-CM | POA: Diagnosis not present

## 2018-02-23 DIAGNOSIS — H2513 Age-related nuclear cataract, bilateral: Secondary | ICD-10-CM | POA: Diagnosis not present

## 2018-02-23 DIAGNOSIS — H52223 Regular astigmatism, bilateral: Secondary | ICD-10-CM | POA: Diagnosis not present

## 2018-02-23 DIAGNOSIS — H527 Unspecified disorder of refraction: Secondary | ICD-10-CM | POA: Diagnosis not present

## 2018-02-23 DIAGNOSIS — E785 Hyperlipidemia, unspecified: Secondary | ICD-10-CM | POA: Diagnosis not present

## 2018-02-23 HISTORY — PX: CATARACT EXTRACTION W/ INTRAOCULAR LENS IMPLANT: SHX1309

## 2018-02-24 DIAGNOSIS — H40003 Preglaucoma, unspecified, bilateral: Secondary | ICD-10-CM | POA: Diagnosis not present

## 2018-02-24 DIAGNOSIS — H52223 Regular astigmatism, bilateral: Secondary | ICD-10-CM | POA: Diagnosis not present

## 2018-02-24 DIAGNOSIS — H527 Unspecified disorder of refraction: Secondary | ICD-10-CM | POA: Diagnosis not present

## 2018-02-24 DIAGNOSIS — Z961 Presence of intraocular lens: Secondary | ICD-10-CM | POA: Diagnosis not present

## 2018-02-24 DIAGNOSIS — H25812 Combined forms of age-related cataract, left eye: Secondary | ICD-10-CM | POA: Diagnosis not present

## 2018-03-02 DIAGNOSIS — H40003 Preglaucoma, unspecified, bilateral: Secondary | ICD-10-CM | POA: Diagnosis not present

## 2018-03-02 DIAGNOSIS — H52223 Regular astigmatism, bilateral: Secondary | ICD-10-CM | POA: Diagnosis not present

## 2018-03-02 DIAGNOSIS — E039 Hypothyroidism, unspecified: Secondary | ICD-10-CM | POA: Diagnosis not present

## 2018-03-02 DIAGNOSIS — Z87891 Personal history of nicotine dependence: Secondary | ICD-10-CM | POA: Diagnosis not present

## 2018-03-02 DIAGNOSIS — K219 Gastro-esophageal reflux disease without esophagitis: Secondary | ICD-10-CM | POA: Diagnosis not present

## 2018-03-02 DIAGNOSIS — H527 Unspecified disorder of refraction: Secondary | ICD-10-CM | POA: Diagnosis not present

## 2018-03-02 DIAGNOSIS — H25813 Combined forms of age-related cataract, bilateral: Secondary | ICD-10-CM | POA: Diagnosis not present

## 2018-03-02 DIAGNOSIS — H25812 Combined forms of age-related cataract, left eye: Secondary | ICD-10-CM | POA: Diagnosis not present

## 2018-03-02 HISTORY — PX: CATARACT EXTRACTION W/ INTRAOCULAR LENS IMPLANT: SHX1309

## 2018-03-03 DIAGNOSIS — H527 Unspecified disorder of refraction: Secondary | ICD-10-CM | POA: Diagnosis not present

## 2018-03-03 DIAGNOSIS — H52221 Regular astigmatism, right eye: Secondary | ICD-10-CM | POA: Diagnosis not present

## 2018-03-03 DIAGNOSIS — Z961 Presence of intraocular lens: Secondary | ICD-10-CM | POA: Diagnosis not present

## 2018-03-03 DIAGNOSIS — H40003 Preglaucoma, unspecified, bilateral: Secondary | ICD-10-CM | POA: Diagnosis not present

## 2018-03-12 ENCOUNTER — Other Ambulatory Visit: Payer: Self-pay | Admitting: Family Medicine

## 2018-04-14 ENCOUNTER — Other Ambulatory Visit: Payer: Self-pay | Admitting: Family Medicine

## 2018-05-20 ENCOUNTER — Other Ambulatory Visit: Payer: Self-pay | Admitting: Family Medicine

## 2018-06-06 ENCOUNTER — Other Ambulatory Visit: Payer: Self-pay | Admitting: Family Medicine

## 2018-06-07 NOTE — Telephone Encounter (Signed)
Can we please call pt and advise him appt needed for refills?  Thanks!

## 2018-06-08 NOTE — Telephone Encounter (Signed)
PT scheduled for 5/8

## 2018-06-09 ENCOUNTER — Ambulatory Visit (INDEPENDENT_AMBULATORY_CARE_PROVIDER_SITE_OTHER): Payer: Medicare Other | Admitting: Family Medicine

## 2018-06-09 ENCOUNTER — Encounter: Payer: Self-pay | Admitting: Family Medicine

## 2018-06-09 VITALS — BP 126/72 | Ht 72.0 in | Wt 158.0 lb

## 2018-06-09 DIAGNOSIS — M79602 Pain in left arm: Secondary | ICD-10-CM | POA: Diagnosis not present

## 2018-06-09 DIAGNOSIS — E039 Hypothyroidism, unspecified: Secondary | ICD-10-CM | POA: Diagnosis not present

## 2018-06-09 DIAGNOSIS — M79601 Pain in right arm: Secondary | ICD-10-CM

## 2018-06-09 DIAGNOSIS — R7301 Impaired fasting glucose: Secondary | ICD-10-CM

## 2018-06-09 DIAGNOSIS — E781 Pure hyperglyceridemia: Secondary | ICD-10-CM | POA: Diagnosis not present

## 2018-06-09 DIAGNOSIS — Z125 Encounter for screening for malignant neoplasm of prostate: Secondary | ICD-10-CM | POA: Diagnosis not present

## 2018-06-09 DIAGNOSIS — M533 Sacrococcygeal disorders, not elsewhere classified: Secondary | ICD-10-CM | POA: Diagnosis not present

## 2018-06-09 DIAGNOSIS — M791 Myalgia, unspecified site: Secondary | ICD-10-CM

## 2018-06-09 MED ORDER — LEVOTHYROXINE SODIUM 137 MCG PO TABS: 137.0000 ug | ORAL_TABLET | Freq: Every day | ORAL | 1 refills | Status: DC

## 2018-06-09 MED ORDER — ATORVASTATIN CALCIUM 20 MG PO TABS: 20.0000 mg | ORAL_TABLET | Freq: Every day | ORAL | 3 refills | Status: DC

## 2018-06-09 NOTE — Progress Notes (Signed)
Virtual Visit via Video Note  I connected with Bobby Lewis on 06/09/18 at 10:10 AM EDT by a video enabled telemedicine application and verified that I am speaking with the correct person using two identifiers.   I discussed the limitations of evaluation and management by telemedicine and the availability of in person appointments. The patient expressed understanding and agreed to proceed.  Pt was at home and I was in my office for the virtual visit.     Subjective:    CC: thyroid and glucose and labs.   HPI:  Hypothyroidism - Taking medication regularly in the AM away from food and vitamins, etc. No recent change to skin, hair, or energy levels.  Impaired fasting glucose-no increased thirst or urination. No symptoms consistent with hypoglycemia.  He also complains of having a lot of stiffness in the forearms.  This was actually 1 of his initial presentations when he was first diagnosed with hypothyroidism.  He is also had some pain in the bilateral side area as well.  It seems to be worse when he is been sitting for a while and it gets better as he gets up and move.  He has been trying to do yoga again recently but not nearly to the intensity he was previously.  Has also had pain in the tailbone. Feels tender. No redness or swelling, never saw a bruise. No injury.  Has been getting better over he last month. .  Hyperlipidemia - tolerating stating well with no myalgias or significant side effects.  Lab Results  Component Value Date   CHOL 117 07/20/2017   CHOL 95 07/12/2016   CHOL 142 09/09/2015   Lab Results  Component Value Date   HDL 38 (L) 07/20/2017   HDL 31 (L) 07/12/2016   HDL 61 09/09/2015   Lab Results  Component Value Date   LDLCALC 64 07/20/2017   LDLCALC 59 09/09/2015   LDLCALC 80 02/18/2015   Lab Results  Component Value Date   TRIG 72 07/20/2017   TRIG 104 07/12/2016   TRIG 108 09/09/2015   Lab Results  Component Value Date   CHOLHDL 3.1 07/20/2017   CHOLHDL 3.1 07/12/2016   CHOLHDL 2.3 09/09/2015   No results found for: LDLDIRECT    BP 126/72   Ht 6' (1.829 m)   Wt 158 lb (71.7 kg)   BMI 21.43 kg/m     No Known Allergies  Past Medical History:  Diagnosis Date  . COPD (chronic obstructive pulmonary disease) ([place])   . Genital warts    history  . Tobacco abuse     Past Surgical History:  Procedure Laterality Date  . CATARACT EXTRACTION W/ INTRAOCULAR LENS IMPLANT Right 02/23/2018  . CATARACT EXTRACTION W/ INTRAOCULAR LENS IMPLANT Left 03/02/2018  . NOSE SURGERY  2002    Social History   Socioeconomic History  . Marital status: Married    Spouse name: Not on file  . Number of children: Not on file  . Years of education: Not on file  . Highest education level: Not on file  Occupational History  . Not on file  Social Needs  . Financial resource strain: Not on file  . Food insecurity:    Worry: Not on file    Inability: Not on file  . Transportation needs:    Medical: Not on file    Non-medical: Not on file  Tobacco Use  . Smoking status: Former Smoker    Packs/day: 1.00    Years: 25.00  Pack years: 25.00    Types: Cigarettes    Last attempt to quit: 02/01/2009    Years since quitting: 9.3  . Smokeless tobacco: Never Used  Substance and Sexual Activity  . Alcohol use: Yes    Alcohol/week: 3.0 standard drinks    Types: 3 Standard drinks or equivalent per week    Comment: daily  . Drug use: No  . 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  Lifestyle  . Physical activity:    Days per week: Not on file    Minutes per session: Not on file  . Stress: Not on file  Relationships  . Social connections:    Talks on phone: Not on file    Gets together: Not on file    Attends religious service: Not on file    Active member of club or organization: Not on file    Attends meetings of clubs or  organizations: Not on file    Relationship status: Not on file  . Intimate partner violence:    Fear of current or ex partner: Not on file    Emotionally abused: Not on file    Physically abused: Not on file    Forced sexual activity: Not on file  Other Topics Concern  . Not on file  Social History Narrative  . Not on file    Family History  Problem Relation Age of Onset  . Joint hypermobility Mother   . Heart attack Father 77       died 61    Outpatient Encounter Medications as of 06/09/2018  Medication Sig  . atorvastatin (LIPITOR) 20 MG tablet Take 1 tablet (20 mg total) by mouth daily.  Marland Kitchen levothyroxine (SYNTHROID) 137 MCG tablet Take 1 tablet (137 mcg total) by mouth daily before breakfast.  . Multiple Vitamin (MULTIVITAMIN PO) Take by mouth.    . Omega-3 Fatty Acids (FISH OIL) 1000 MG CAPS Take 2,000 mg by mouth daily.  . [DISCONTINUED] atorvastatin (LIPITOR) 20 MG tablet TAKE ONE TABLET BY MOUTH DAILY.  . [DISCONTINUED] levothyroxine (SYNTHROID) 137 MCG tablet TAKE 1 TABLET BY MOUTH DAILY BEFORE BREAKFAST.MUST SCHEDULE/KEEP APPOINTMENT FOR REFILLS  . [DISCONTINUED] aspirin 81 MG tablet Take 81 mg by mouth daily.    . [DISCONTINUED] latanoprost (XALATAN) 0.005 % ophthalmic solution Place 1 drop into both eyes at bedtime.   No facility-administered encounter medications on file as of 06/09/2018.       Review of Systems: No fevers, chills, night sweats, weight loss, chest pain, or shortness of breath.   Objective:    General: Speaking clearly in complete sentences without any shortness of breath.  Alert and oriented x3.  Normal judgment. No apparent acute distress.    Impression and Recommendations:    Hypothyroid - 15mcg daily. Due to recheck levels.    IFG - due to recheck levels.   Hyperlipidemia-doing well on Lipitor 20 mg.  Due to recheck lipids in liver enzymes.  Arm Pain/stiffness - will recheck tsh.  Also recheck CK levels as well as a CBC.  The last time  that he got stiffness in his forearms in particular was when he was diagnosed with hypothyroidism so it may be that his thyroid level is off.  Tailbone pain - using donut cusion. Not likely a pilonidal cyst, etc. Getting some better over the last month.  We discussed that if  it continues we could always get an x-ray for further work-up.  Without a specific known injury it is a little bit unusual but it also may be that he is been a little bit more sedentary and that just has put extra pressure on the tailbone.  He says his weights the same so he has not lost more weight.   I discussed the assessment and treatment plan with the patient. The patient was provided an opportunity to ask questions and all were answered. The patient agreed with the plan and demonstrated an understanding of the instructions.   The patient was advised to call back or seek an in-person evaluation if the symptoms worsen or if the condition fails to improve as anticipated.   Beatrice Lecher, MD

## 2018-06-14 DIAGNOSIS — M79601 Pain in right arm: Secondary | ICD-10-CM | POA: Diagnosis not present

## 2018-06-14 DIAGNOSIS — R7301 Impaired fasting glucose: Secondary | ICD-10-CM | POA: Diagnosis not present

## 2018-06-14 DIAGNOSIS — E781 Pure hyperglyceridemia: Secondary | ICD-10-CM | POA: Diagnosis not present

## 2018-06-14 DIAGNOSIS — M79602 Pain in left arm: Secondary | ICD-10-CM | POA: Diagnosis not present

## 2018-06-14 DIAGNOSIS — E039 Hypothyroidism, unspecified: Secondary | ICD-10-CM | POA: Diagnosis not present

## 2018-06-14 DIAGNOSIS — Z125 Encounter for screening for malignant neoplasm of prostate: Secondary | ICD-10-CM | POA: Diagnosis not present

## 2018-06-14 DIAGNOSIS — M791 Myalgia, unspecified site: Secondary | ICD-10-CM | POA: Diagnosis not present

## 2018-06-15 LAB — LIPID PANEL
Cholesterol: 134 mg/dL (ref <200)
HDL: 42 mg/dL (ref >=40)
LDL Cholesterol (Calc): 74 mg/dL (calc)
Non-HDL Cholesterol (Calc): 92 mg/dL (calc) (ref <130)
Total CHOL/HDL Ratio: 3.2 (calc) (ref <5.0)
Triglycerides: 99 mg/dL (ref <150)

## 2018-06-15 LAB — COMPLETE METABOLIC PANEL WITH GFR
AG Ratio: 1.8 (calc) (ref 1.0–2.5)
ALT: 18 U/L (ref 9–46)
AST: 19 U/L (ref 10–35)
Albumin: 4.7 g/dL (ref 3.6–5.1)
Alkaline phosphatase (APISO): 82 U/L (ref 35–144)
BUN: 10 mg/dL (ref 7–25)
CO2: 26 mmol/L (ref 20–32)
Calcium: 9.8 mg/dL (ref 8.6–10.3)
Chloride: 107 mmol/L (ref 98–110)
Creat: 0.79 mg/dL (ref 0.70–1.25)
GFR, Est African American: 108 mL/min/{1.73_m2} (ref >=60)
GFR, Est Non African American: 93 mL/min/{1.73_m2} (ref >=60)
Globulin: 2.6 g/dL (calc) (ref 1.9–3.7)
Glucose, Bld: 100 mg/dL — ABNORMAL HIGH (ref 65–99)
Potassium: 4.5 mmol/L (ref 3.5–5.3)
Sodium: 142 mmol/L (ref 135–146)
Total Bilirubin: 0.4 mg/dL (ref 0.2–1.2)
Total Protein: 7.3 g/dL (ref 6.1–8.1)

## 2018-06-15 LAB — CBC WITH DIFFERENTIAL/PLATELET
Absolute Monocytes: 533 cells/uL (ref 200–950)
Basophils Absolute: 38 cells/uL (ref 0–200)
Basophils Relative: 0.5 %
Eosinophils Absolute: 218 cells/uL (ref 15–500)
Eosinophils Relative: 2.9 %
HCT: 44.1 % (ref 38.5–50.0)
Hemoglobin: 14.8 g/dL (ref 13.2–17.1)
Lymphs Abs: 1395 cells/uL (ref 850–3900)
MCH: 30.7 pg (ref 27.0–33.0)
MCHC: 33.6 g/dL (ref 32.0–36.0)
MCV: 91.5 fL (ref 80.0–100.0)
MPV: 11 fL (ref 7.5–12.5)
Monocytes Relative: 7.1 %
Neutro Abs: 5318 cells/uL (ref 1500–7800)
Neutrophils Relative %: 70.9 %
Platelets: 272 10*3/uL (ref 140–400)
RBC: 4.82 10*6/uL (ref 4.20–5.80)
RDW: 12.5 % (ref 11.0–15.0)
Total Lymphocyte: 18.6 %
WBC: 7.5 10*3/uL (ref 3.8–10.8)

## 2018-06-15 LAB — HEMOGLOBIN A1C
Hgb A1c MFr Bld: 5.8 % of total Hgb — ABNORMAL HIGH (ref <5.7)
Mean Plasma Glucose: 120 (calc)
eAG (mmol/L): 6.6 (calc)

## 2018-06-15 LAB — TSH: TSH: 0.32 mIU/L — ABNORMAL LOW (ref 0.40–4.50)

## 2018-06-15 LAB — CK: Total CK: 81 U/L (ref 44–196)

## 2018-06-15 LAB — T4, FREE: Free T4: 1.8 ng/dL (ref 0.8–1.8)

## 2018-06-15 LAB — PSA: PSA: 0.6 ng/mL (ref <=4.0)

## 2018-09-04 ENCOUNTER — Other Ambulatory Visit: Payer: Self-pay

## 2018-10-23 ENCOUNTER — Encounter: Payer: Self-pay | Admitting: Family Medicine

## 2018-10-24 ENCOUNTER — Encounter: Payer: Self-pay | Admitting: Family Medicine

## 2018-10-24 DIAGNOSIS — M609 Myositis, unspecified: Secondary | ICD-10-CM

## 2018-10-24 DIAGNOSIS — E039 Hypothyroidism, unspecified: Secondary | ICD-10-CM

## 2018-10-24 NOTE — Telephone Encounter (Signed)
Let do both.  But see if it is possibly related to the thyroid now that the thyroid is back in sync or if it could be more of a musculoskeletal issue.

## 2018-10-25 ENCOUNTER — Other Ambulatory Visit: Payer: Self-pay

## 2018-10-25 ENCOUNTER — Ambulatory Visit (INDEPENDENT_AMBULATORY_CARE_PROVIDER_SITE_OTHER): Payer: Medicare Other

## 2018-10-25 ENCOUNTER — Encounter: Payer: Self-pay | Admitting: Sports Medicine

## 2018-10-25 ENCOUNTER — Ambulatory Visit (INDEPENDENT_AMBULATORY_CARE_PROVIDER_SITE_OTHER): Payer: Medicare Other | Admitting: Sports Medicine

## 2018-10-25 VITALS — BP 153/63 | HR 65 | Ht 72.0 in | Wt 160.0 lb

## 2018-10-25 DIAGNOSIS — M791 Myalgia, unspecified site: Secondary | ICD-10-CM | POA: Diagnosis not present

## 2018-10-25 DIAGNOSIS — M545 Low back pain, unspecified: Secondary | ICD-10-CM | POA: Insufficient documentation

## 2018-10-25 DIAGNOSIS — G8929 Other chronic pain: Secondary | ICD-10-CM | POA: Diagnosis not present

## 2018-10-25 DIAGNOSIS — R2242 Localized swelling, mass and lump, left lower limb: Secondary | ICD-10-CM

## 2018-10-25 DIAGNOSIS — M5441 Lumbago with sciatica, right side: Secondary | ICD-10-CM | POA: Diagnosis not present

## 2018-10-25 DIAGNOSIS — Z23 Encounter for immunization: Secondary | ICD-10-CM | POA: Diagnosis not present

## 2018-10-25 DIAGNOSIS — E559 Vitamin D deficiency, unspecified: Secondary | ICD-10-CM

## 2018-10-25 DIAGNOSIS — M5442 Lumbago with sciatica, left side: Secondary | ICD-10-CM

## 2018-10-25 DIAGNOSIS — Z0389 Encounter for observation for other suspected diseases and conditions ruled out: Secondary | ICD-10-CM | POA: Diagnosis not present

## 2018-10-25 DIAGNOSIS — M79652 Pain in left thigh: Secondary | ICD-10-CM | POA: Diagnosis not present

## 2018-10-25 MED ORDER — PREDNISONE 50 MG PO TABS: ORAL_TABLET | ORAL | 0 refills | Status: DC

## 2018-10-25 MED ORDER — CYCLOBENZAPRINE HCL 5 MG PO TABS: ORAL_TABLET | ORAL | 3 refills | Status: DC

## 2018-10-25 NOTE — Assessment & Plan Note (Addendum)
Differential for myalgias is very broad. These include lumbar spinal stenosis, iatrogenic causes such as his atorvastatin. 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 Checking CBC, CMP, TSH, T3, T4, CK, ESR, CRP, ANA levels, serum and urine protein electrophoresis.  Some of the lab tests are back,TSH, T3, T4 are normal.  Vitamin D is low, calling in supplement.  All of this would indicate that it is not thyroid myopathy.  Stop the atorvastatin for now and let us see how the pain evolves.    We are still awaiting some of the rheumatoid testing.

## 2018-10-25 NOTE — Assessment & Plan Note (Signed)
Adding prednisone. X-rays. Some of his symptoms rating down the thighs could certainly be from lumbar spinal stenosis. Adding Flexeril low-dose to be taken 3 times daily.

## 2018-10-25 NOTE — Assessment & Plan Note (Signed)
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 Starting with left femur x-ray, thigh ultrasound. I do suspect we are going to need an MRI with contrast.

## 2018-10-25 NOTE — Addendum Note (Signed)
Addended by: Beatris Ship L on: 10/25/2018 10:27 AM   Modules accepted: Orders

## 2018-10-25 NOTE — Progress Notes (Addendum)
Subjective:    CC: Myalgias  HPI: This is a pleasant Bobby Lewis, he has a history of thyroid myopathy with an extremely elevated TSH, this all improved with treating his thyroid disease.  Unfortunately he has noted increased muscle aches and body aches in the arms and legs, he has significant tightness in his vastus lateralis bilaterally as well as his flexor pronator mass in the upper extremities.  He does have a bit of back pain as well.  No changes in his medications, he has been taking atorvastatin for years.  Symptoms are moderate, persistent.  I reviewed the past medical history, family history, social history, surgical history, and allergies today and no changes were needed.  Please see the problem list section below in epic for further details.  Past Medical History: Past Medical History:  Diagnosis Date  . COPD (chronic obstructive pulmonary disease) (HCC)   . Genital warts    history  . Tobacco abuse    Past Surgical History: Past Surgical History:  Procedure Laterality Date  . CATARACT EXTRACTION W/ INTRAOCULAR LENS IMPLANT Right 02/23/2018  . CATARACT EXTRACTION W/ INTRAOCULAR LENS IMPLANT Left 03/02/2018  . NOSE SURGERY  2002   Social History: Social History   Socioeconomic History  . Marital status: Married    Spouse name: Not on file  . Number of children: Not on file  . Years of education: Not on file  . Highest education level: Not on file  Occupational History  . Not on file  Social Needs  . Financial resource strain: Not on file  . Food insecurity    Worry: Not on file    Inability: Not on file  . Transportation needs    Medical: Not on file    Non-medical: Not on file  Tobacco Use  . Smoking status: Former Smoker    Packs/day: 1.00    Years: 25.00    Pack years: 25.00    Types: Cigarettes    Quit date: 02/01/2009    Years since quitting: 9.7  . Smokeless tobacco: Never Used  Substance and Sexual Activity  . Alcohol use: Yes   Alcohol/week: 3.0 standard drinks    Types: 3 Standard drinks or equivalent per week    Comment: daily  . Drug use: No  . 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  Lifestyle  . Physical activity    Days per week: Not on file    Minutes per session: Not on file  . Stress: Not on file  Relationships  . Social connections    Talks on phone: Not on file    Gets together: Not on file    Attends religious service: Not on file    Active member of club or organization: Not on file    Attends meetings of clubs or organizations: Not on file    Relationship status: Not on file  Other Topics Concern  . Not on file  Social History Narrative  . Not on file   Family History: Family History  Problem Relation Age of Onset  . Joint hypermobility Mother   . Heart attack Father 60       died 67   Allergies: No Known Allergies Medications: See med rec.  Review of Systems: No fevers, chills, night sweats, weight loss, chest pain, or shortness of breath.   Objective:    General:   Well Developed, well nourished, and in no acute distress.  Neuro: Alert and oriented x3, extra-ocular muscles intact, sensation grossly intact.  HEENT: Normocephalic, atraumatic, pupils equal round reactive to light, neck supple, no masses, no lymphadenopathy, thyroid nonpalpable.  Skin: Warm and dry, no rashes. Cardiac: Regular rate and rhythm, no murmurs rubs or gallops, no lower extremity edema.  Respiratory: Clear to auscultation bilaterally. Not using accessory muscles, speaking in full sentences. Upper extremities: There is palpable spasm in his flexor pronator mass bilaterally. Lower extremities: There is a thick area of spasm in his left mid vastus lateralis, there does feel to be a subcutaneous or vastus lateralis mass.  He has something similar on his right side albeit less severe.  Neurovascular  intact distally, and negative Homans sign.   Impression and Recommendations:    Low back pain Adding prednisone. X-rays. Some of his symptoms rating down the thighs could certainly be from lumbar spinal stenosis. Adding Flexeril low-dose to be taken 3 times daily.  Mass of thigh, left Firm subcutaneous/vastus lateralis mass in the left mid thigh. Starting with left femur x-ray, thigh ultrasound. I do suspect we are going to need an MRI with contrast.  Myalgia Differential for myalgias is very broad. These include lumbar spinal stenosis, iatrogenic causes such as his atorvastatin. 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 Checking CBC, CMP, TSH, T3, T4, CK, ESR, CRP, ANA levels, serum and urine protein electrophoresis.  Some of the lab tests are back,TSH, T3, T4 are normal.  Vitamin D is low, calling in supplement.  All of this would indicate that it is not thyroid myopathy.  Stop the atorvastatin for now and let us see how the pain evolves.    We are still awaiting some of the rheumatoid testing.   ___________________________________________ Thomas J. Thekkekandam, M.D., ABFM., CAQSM. Primary Care and Sports Medicine [facility]  Adjunct Professor of Family Medicine  [employer] 

## 2018-10-26 MED ORDER — VITAMIN D (ERGOCALCIFEROL) 1.25 MG (50000 UNIT) PO CAPS: 50000.0000 [IU] | ORAL_CAPSULE | ORAL | 0 refills | Status: DC

## 2018-10-26 NOTE — Addendum Note (Signed)
Addended by: Silverio Decamp on: 10/26/2018 09:15 AM   Modules accepted: Orders

## 2018-10-27 LAB — CBC
HCT: 44.2 % (ref 38.5–50.0)
Hemoglobin: 14.9 g/dL (ref 13.2–17.1)
MCH: 31.4 pg (ref 27.0–33.0)
MCHC: 33.7 g/dL (ref 32.0–36.0)
MCV: 93.2 fL (ref 80.0–100.0)
MPV: 11.2 fL (ref 7.5–12.5)
Platelets: 252 10*3/uL (ref 140–400)
RBC: 4.74 10*6/uL (ref 4.20–5.80)
RDW: 13 % (ref 11.0–15.0)
WBC: 9 10*3/uL (ref 3.8–10.8)

## 2018-10-27 LAB — PROTEIN ELECTROPHORESIS, SERUM, WITH REFLEX
Albumin ELP: 4.6 g/dL (ref 3.8–4.8)
Alpha 1: 0.3 g/dL (ref 0.2–0.3)
Alpha 2: 0.8 g/dL (ref 0.5–0.9)
Beta 2: 0.3 g/dL (ref 0.2–0.5)
Beta Globulin: 0.4 g/dL (ref 0.4–0.6)
Gamma Globulin: 1 g/dL (ref 0.8–1.7)
Total Protein: 7.4 g/dL (ref 6.1–8.1)

## 2018-10-27 LAB — PROTEIN ELECTROPHORESIS,RANDOM URN
Creatinine, Urine: 29 mg/dL (ref 20–320)
Total Protein, Urine: <4 mg/dL — ABNORMAL LOW (ref 5–25)

## 2018-10-27 LAB — VITAMIN D 25 HYDROXY (VIT D DEFICIENCY, FRACTURES): Vit D, 25-Hydroxy: 20 ng/mL — ABNORMAL LOW (ref 30–100)

## 2018-10-27 LAB — COMPLETE METABOLIC PANEL WITH GFR
AG Ratio: 1.9 (calc) (ref 1.0–2.5)
ALT: 19 U/L (ref 9–46)
AST: 21 U/L (ref 10–35)
Albumin: 4.7 g/dL (ref 3.6–5.1)
Alkaline phosphatase (APISO): 87 U/L (ref 35–144)
BUN: 11 mg/dL (ref 7–25)
CO2: 26 mmol/L (ref 20–32)
Calcium: 9.7 mg/dL (ref 8.6–10.3)
Chloride: 105 mmol/L (ref 98–110)
Creat: 0.81 mg/dL (ref 0.70–1.25)
GFR, Est African American: 107 mL/min/{1.73_m2} (ref >=60)
GFR, Est Non African American: 92 mL/min/{1.73_m2} (ref >=60)
Globulin: 2.5 g/dL (calc) (ref 1.9–3.7)
Glucose, Bld: 101 mg/dL — ABNORMAL HIGH (ref 65–99)
Potassium: 3.8 mmol/L (ref 3.5–5.3)
Sodium: 140 mmol/L (ref 135–146)
Total Bilirubin: 0.6 mg/dL (ref 0.2–1.2)
Total Protein: 7.2 g/dL (ref 6.1–8.1)

## 2018-10-27 LAB — MAGNESIUM: Magnesium: 1.9 mg/dL (ref 1.5–2.5)

## 2018-10-27 LAB — ANA, IFA COMPREHENSIVE PANEL
Anti Nuclear Antibody (ANA): NEGATIVE
ENA SM Ab Ser-aCnc: <1 AI
SM/RNP: <1 AI
SSA (Ro) (ENA) Antibody, IgG: <1 AI
SSB (La) (ENA) Antibody, IgG: <1 AI
Scleroderma (Scl-70) (ENA) Antibody, IgG: <1 AI
ds DNA Ab: <1 IU/mL

## 2018-10-27 LAB — TSH: TSH: 2.21 mIU/L (ref 0.40–4.50)

## 2018-10-27 LAB — T3, FREE: T3, Free: 3.1 pg/mL (ref 2.3–4.2)

## 2018-10-27 LAB — CK: Total CK: 166 U/L (ref 44–196)

## 2018-10-27 LAB — SEDIMENTATION RATE: Sed Rate: 2 mm/h (ref 0–20)

## 2018-10-27 LAB — T4, FREE: Free T4: 1.4 ng/dL (ref 0.8–1.8)

## 2018-10-27 LAB — C-REACTIVE PROTEIN: CRP: 4.3 mg/L (ref <8.0)

## 2018-11-20 ENCOUNTER — Other Ambulatory Visit: Payer: Self-pay

## 2018-11-20 ENCOUNTER — Ambulatory Visit (INDEPENDENT_AMBULATORY_CARE_PROVIDER_SITE_OTHER): Payer: Medicare Other | Admitting: Sports Medicine

## 2018-11-20 ENCOUNTER — Encounter: Payer: Self-pay | Admitting: Sports Medicine

## 2018-11-20 DIAGNOSIS — M791 Myalgia, unspecified site: Secondary | ICD-10-CM | POA: Diagnosis not present

## 2018-11-20 NOTE — Assessment & Plan Note (Signed)
The differential here was very broad, he did have a history of thyroid myopathy, now thyroid function is well controlled, CBC, CMP, TSH, T3, T4, CK, ESR, CRP, ANA, SPEP were all for the most part unremarkable, vitamin D was low, we supplemented this, he has not noted any improvement. We stopped his atorvastatin, he did really not get much better. The 5 days of prednisone really did not help much. He feels as though he can live with it for now but he will think about it over the next month, and if he would like to pursue additional work-up then I would do a referral to neurology, I would likely MRI his cervical and lumbar spine, and I would likely refer him for a muscle biopsy. I think he can go ahead and restart his atorvastatin since he did not notice much improvement stopping it.

## 2018-11-20 NOTE — Progress Notes (Signed)
Subjective:    CC: Follow-up  HPI: This is a very pleasant 68 year old male, he is following up for diffuse myalgias.  He does have a history of severe hypothyroidism with thyroid myopathy, he has very tight flexor pronator mass in his forearms as well as a very tight and moderately tender vastus lateralis bilaterally.  We did a fairly extensive work-up without much uncovered with the exception of a low vitamin D which is being repleted.  We held his atorvastatin without much improvement.  Lumbar spine x-ray was unremarkable.  Prednisone was not effective.  I reviewed the past medical history, family history, social history, surgical history, and allergies today and no changes were needed.  Please see the problem list section below in epic for further details.  Past Medical History: Past Medical History:  Diagnosis Date  . COPD (chronic obstructive pulmonary disease) ([place])   . Genital warts    history  . Tobacco abuse    Past Surgical History: Past Surgical History:  Procedure Laterality Date  . CATARACT EXTRACTION W/ INTRAOCULAR LENS IMPLANT Right 02/23/2018  . CATARACT EXTRACTION W/ INTRAOCULAR LENS IMPLANT Left 03/02/2018  . NOSE SURGERY  2002   Social History: Social History   Socioeconomic History  . Marital status: Married    Spouse name: Not on file  . Number of children: Not on file  . Years of education: Not on file  . Highest education level: Not on file  Occupational History  . Not on file  Social Needs  . Financial resource strain: Not on file  . Food insecurity    Worry: Not on file    Inability: Not on file  . Transportation needs    Medical: Not on file    Non-medical: Not on file  Tobacco Use  . Smoking status: Former Smoker    Packs/day: 1.00    Years: 25.00    Pack years: 25.00    Types: Cigarettes    Quit date: 02/01/2009    Years since quitting: 9.8  . Smokeless tobacco: Never Used  Substance and Sexual Activity  . Alcohol use: Yes   Alcohol/week: 3.0 standard drinks    Types: 3 Standard drinks or equivalent per week    Comment: daily  . Drug use: No  . 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  Lifestyle  . Physical activity    Days per week: Not on file    Minutes per session: Not on file  . Stress: Not on file  Relationships  . Social Herbalist on phone: Not on file    Gets together: Not on file    Attends religious service: Not on file    Active member of club or organization: Not on file    Attends meetings of clubs or organizations: Not on file    Relationship status: Not on file  Other Topics Concern  . Not on file  Social History Narrative  . Not on file   Family History: Family History  Problem Relation Age of Onset  . Joint hypermobility Mother   . Heart attack Father 104       died 59   Allergies: No Known Allergies Medications: See med rec.  Review of Systems: No fevers, chills, night sweats, weight loss, chest pain, or shortness of breath.   Objective:    General: Well Developed,  well nourished, and in no acute distress.  Neuro: Alert and oriented x3, extra-ocular muscles intact, sensation grossly intact.  HEENT: Normocephalic, atraumatic, pupils equal round reactive to light, neck supple, no masses, no lymphadenopathy, thyroid nonpalpable.  Skin: Warm and dry, no rashes. Cardiac: Regular rate and rhythm, no murmurs rubs or gallops, no lower extremity edema.  Respiratory: Clear to auscultation bilaterally. Not using accessory muscles, speaking in full sentences.  Impression and Recommendations:    Myalgia The differential here was very broad, he did have a history of thyroid myopathy, now thyroid function is well controlled, CBC, CMP, TSH, T3, T4, CK, ESR, CRP, ANA, SPEP were all for the most part unremarkable, vitamin D was low, we supplemented this, he has not  noted any improvement. We stopped his atorvastatin, he did really not get much better. The 5 days of prednisone really did not help much. He feels as though he can live with it for now but he will think about it over the next month, and if he would like to pursue additional work-up then I would do a referral to neurology, I would likely MRI his cervical and lumbar spine, and I would likely refer him for a muscle biopsy. I think he can go ahead and restart his atorvastatin since he did not notice much improvement stopping it.   ___________________________________________ Gwen Her. Dianah Field, M.D., ABFM., CAQSM. Primary Care and Sports Medicine [facility]  Adjunct Professor of [employer] [facility]

## 2018-12-06 ENCOUNTER — Other Ambulatory Visit: Payer: Self-pay | Admitting: Family Medicine

## 2019-03-24 ENCOUNTER — Ambulatory Visit: Payer: Medicare Other | Attending: Internal Medicine

## 2019-03-24 DIAGNOSIS — Z23 Encounter for immunization: Secondary | ICD-10-CM

## 2019-03-24 NOTE — Progress Notes (Signed)
   Covid-19 Vaccination Clinic  Name:  Bobby Lewis    MRN: [id number] DOB: 1950/04/01  03/24/2019  Mr. Plano was observed post Covid-19 immunization for 15 minutes without incidence. He was provided with Vaccine Information Sheet and instruction to access the V-Safe system.   Mr. Sabia was instructed to call 911 with any severe reactions post vaccine: Marland Kitchen Difficulty breathing  . Swelling of your face and throat  . A fast heartbeat  . A bad rash all over your body  . Dizziness and weakness    Immunizations Administered    Name Date Dose VIS Date Route   Pfizer COVID-19 Vaccine 03/24/2019 10:54 AM 0.3 mL 01/12/2019 Intramuscular   Manufacturer: [place]   Lot: [id number]   [place]: [id number]

## 2019-04-16 ENCOUNTER — Ambulatory Visit (INDEPENDENT_AMBULATORY_CARE_PROVIDER_SITE_OTHER): Payer: Medicare Other | Admitting: Medical-Surgical

## 2019-04-16 ENCOUNTER — Ambulatory Visit: Payer: Medicare Other | Attending: Internal Medicine

## 2019-04-16 ENCOUNTER — Encounter: Payer: Self-pay | Admitting: Medical-Surgical

## 2019-04-16 ENCOUNTER — Encounter: Payer: Self-pay | Admitting: Family Medicine

## 2019-04-16 VITALS — BP 145/67 | HR 65 | Temp 97.9°F | Ht 72.0 in | Wt 159.1 lb

## 2019-04-16 DIAGNOSIS — R319 Hematuria, unspecified: Secondary | ICD-10-CM | POA: Diagnosis not present

## 2019-04-16 DIAGNOSIS — Z23 Encounter for immunization: Secondary | ICD-10-CM

## 2019-04-16 DIAGNOSIS — M549 Dorsalgia, unspecified: Secondary | ICD-10-CM

## 2019-04-16 LAB — POCT URINALYSIS DIP (CLINITEK)
Bilirubin, UA: NEGATIVE
Glucose, UA: NEGATIVE mg/dL
Leukocytes, UA: NEGATIVE
Nitrite, UA: NEGATIVE
POC PROTEIN,UA: 30 — AB
Spec Grav, UA: >=1.03 — AB (ref 1.010–1.025)
Urobilinogen, UA: 0.2 E.U./dL
pH, UA: 6.5 (ref 5.0–8.0)

## 2019-04-16 MED ORDER — TAMSULOSIN HCL 0.4 MG PO CAPS: 0.4000 mg | ORAL_CAPSULE | Freq: Every day | ORAL | 3 refills | Status: DC

## 2019-04-16 MED ORDER — HYDROCODONE-ACETAMINOPHEN 5-325 MG PO TABS: 1.0000 | ORAL_TABLET | Freq: Two times a day (BID) | ORAL | 0 refills | Status: DC | PRN

## 2019-04-16 NOTE — Progress Notes (Signed)
Subjective:    CC: Right lower back pain, right flank pain  HPI: Very pleasant 69 year old male presenting today with right-sided lower back/flank pain accompanied with vomiting and nausea.  Notes decreased appetite and intake.  Pain started after playing golf on Thursday, improved Friday and Saturday during the day.  Pain returned Saturday evening and since has been "awful".  Intermittent, colicky, stabbing pain currently rated 5/10 but at worst is 7-9/10.  Has not taken anything for pain.  No history of kidney stones.  No burning, frequency, urgency, or penile discharge noted.  Denies fevers, chills, myalgias.  I reviewed the past medical history, family history, social history, surgical history, and allergies today and no changes were needed.  Please see the problem list section below in epic for further details.  Past Medical History: Past Medical History:  Diagnosis Date  . COPD (chronic obstructive pulmonary disease) ([place])   . Genital warts    history  . Tobacco abuse    Past Surgical History: Past Surgical History:  Procedure Laterality Date  . CATARACT EXTRACTION W/ INTRAOCULAR LENS IMPLANT Right 02/23/2018  . CATARACT EXTRACTION W/ INTRAOCULAR LENS IMPLANT Left 03/02/2018  . NOSE SURGERY  2002   Social History: Social History   Socioeconomic History  . Marital status: Married    Spouse name: Not on file  . Number of children: Not on file  . Years of education: Not on file  . Highest education level: Not on file  Occupational History  . Not on file  Tobacco Use  . Smoking status: Former Smoker    Packs/day: 1.00    Years: 25.00    Pack years: 25.00    Types: Cigarettes    Quit date: 02/01/2009    Years since quitting: 10.2  . Smokeless tobacco: Never Used  Substance and Sexual Activity  . Alcohol use: Yes    Alcohol/week: 3.0 standard drinks    Types: 3 Standard drinks or equivalent per week    Comment: daily  . Drug use: No  . 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  Other Topics Concern  . Not on file  Social History Narrative  . Not on file   Social Determinants of Health   Financial Resource Strain:   . Difficulty of Paying Living Expenses:   Food Insecurity:   . Worried About Charity fundraiser in the Last Year:   . Arboriculturist in the Last Year:   Transportation Needs:   . Film/video editor (Medical):   Marland Kitchen Lack of Transportation (Non-Medical):   Physical Activity:   . Days of Exercise per Week:   . Minutes of Exercise per Session:   Stress:   . Feeling of Stress :   Social Connections:   . Frequency of Communication with Friends and Family:   . Frequency of Social Gatherings with Friends and Family:   . Attends Religious Services:   . Active Member of Clubs or Organizations:   . Attends Archivist Meetings:   Marland Kitchen Marital Status:    Family History: Family History  Problem Relation Age of Onset  . Joint hypermobility Mother   . Heart attack Father 13       died 55   Allergies: No Known Allergies Medications: See med rec.  Review of Systems: No fevers, chills, night sweats, weight loss, chest  pain, or shortness of breath.   Objective:    General: Well Developed, well nourished, and in no acute distress.  Neuro: Alert and oriented x3.  HEENT: Normocephalic, atraumatic.  Skin: Warm and dry. Cardiac: Regular rate and rhythm, no murmurs rubs or gallops, no lower extremity edema.  Respiratory: Clear to auscultation bilaterally. Not using accessory muscles, speaking in full sentences. Abdomen: Soft, nontender, nondistended. BS + x 4 quadrants. Right CVA tenderness.   Impression and Recommendations:    Right-sided low back/flank pain with hematuria Symptoms strongly suspicious for kidney stone.  Giving tamsulosin 0.4 mg daily.  Norco 5-325 mg 1 tab twice daily as needed.  May also use  ibuprofen 600 mg every 6 hours as needed for pain.  Push p.o. fluids.  Urine strainer provided with instructions on use.  Patient information on kidney stones included with AVS.  Return if symptoms worsen or fail to improve.  ___________________________________________ Clearnce Sorrel, DNP, APRN, FNP-BC Primary Care and [place]

## 2019-04-16 NOTE — Patient Instructions (Addendum)
Ibuprofen 600mg  every 6 hours  Norco (hydrocodone-APAP) 1 tab twice daily as needed   Kidney Stones Kidney stones are rock-like masses that form inside of the kidneys. Kidneys are organs that make pee (urine). A kidney stone may move into other parts of the urinary tract, including:  The tubes that connect the kidneys to the bladder (ureters).  The bladder.  The tube that carries urine out of the body (urethra). Kidney stones can cause very bad pain and can block the flow of pee. The stone usually leaves your body (passes) through your pee. You may need to have a doctor take out the stone. What are the causes? Kidney stones may be caused by:  A condition in which certain glands make too much parathyroid hormone (primary hyperparathyroidism).  A buildup of a type of crystals in the bladder made of a chemical called uric acid. The body makes uric acid when you eat certain foods.  Narrowing (stricture) of one or both of the ureters.  A kidney blockage that you were born with.  Past surgery on the kidney or the ureters, such as gastric bypass surgery. What increases the risk? You are more likely to develop this condition if:  You have had a kidney stone in the past.  You have a family history of kidney stones.  You do not drink enough water.  You eat a diet that is high in protein, salt (sodium), or sugar.  You are overweight or very overweight (obese). What are the signs or symptoms? Symptoms of a kidney stone may include:  Pain in the side of the belly, right below the ribs (flank pain). Pain usually spreads (radiates) to the groin.  Needing to pee often or right away (urgently).  Pain when going pee (urinating).  Blood in your pee (hematuria).  Feeling like you may vomit (nauseous).  Vomiting.  Fever and chills. How is this treated? Treatment depends on the size, location, and makeup of the kidney stones. The stones will often pass out of the body through peeing.  You may need to:  Drink more fluid to help pass the stone. In some cases, you may be given fluids through an IV tube put into one of your veins at the hospital.  Take medicine for pain.  Make changes in your diet to help keep kidney stones from coming back. Sometimes, medical procedures are needed to remove a kidney stone. This may involve:  A procedure to break up kidney stones using a beam of light (laser) or shock waves.  Surgery to remove the kidney stones. Follow these instructions at home: Medicines  Take over-the-counter and prescription medicines only as told by your doctor.  Ask your doctor if the medicine prescribed to you requires you to avoid driving or using heavy machinery. Eating and drinking  Drink enough fluid to keep your pee pale yellow. You may be told to drink at least 8-10 glasses of water each day. This will help you pass the stone.  If told by your doctor, change your diet. This may include: ? Limiting how much salt you eat. ? Eating more fruits and vegetables. ? Limiting how much meat, poultry, fish, and eggs you eat.  Follow instructions from your doctor about eating or drinking restrictions. General instructions  Collect pee samples as told by your doctor. You may need to collect a pee sample: ? 24 hours after a stone comes out. ? 8-12 weeks after a stone comes out, and every 6-12 months after that.  Strain your pee every time you pee (urinate), for as long as told. Use the strainer that your doctor recommends.  Do not throw out the stone. Keep it so that it can be tested by your doctor.  Keep all follow-up visits as told by your doctor. This is important. You may need follow-up tests. How is this prevented? To prevent another kidney stone:  Drink enough fluid to keep your pee pale yellow. This is the best way to prevent kidney stones.  Eat healthy foods.  Avoid certain foods as told by your doctor. You may be told to eat less protein.  Stay  at a healthy weight. Where to find more information  [place] ([facility]): www.kidney.[facility]): www.urologyhealth.org Contact a doctor if:  You have pain that gets worse or does not get better with medicine. Get help right away if:  You have a fever or chills.  You get very bad pain.  You get new pain in your belly (abdomen).  You pass out (faint).  You cannot pee. Summary  Kidney stones are rock-like masses that form inside of the kidneys.  Kidney stones can cause very bad pain and can block the flow of pee.  The stones will often pass out of the body through peeing.  Drink enough fluid to keep your pee pale yellow. This information is not intended to replace advice given to you by your health care provider. Make sure you discuss any questions you have with your health care provider. Document Revised: 06/06/2018 Document Reviewed: 06/06/2018 Elsevier Patient Education  [place].

## 2019-04-16 NOTE — Progress Notes (Signed)
   Covid-19 Vaccination Clinic  Name:  AIDEN SCHARPF    MRN: [id number] DOB: [birth year]  04/16/2019  Mr. Signor was observed post Covid-19 immunization for 15 minutes without incident. He was provided with Vaccine Information Sheet and instruction to access the V-Safe system.   Mr. Schantz was instructed to call 911 with any severe reactions post vaccine: Marland Kitchen Difficulty breathing  . Swelling of face and throat  . A fast heartbeat  . A bad rash all over body  . Dizziness and weakness   Immunizations Administered    Name Date Dose VIS Date Route   Pfizer COVID-19 Vaccine 04/16/2019 10:37 AM 0.3 mL 01/12/2019 Intramuscular   Manufacturer: [place]   Lot: [id number]   [place]: [id number]

## 2019-04-17 LAB — URINE CULTURE
MICRO NUMBER:: 10252106
Result:: NO GROWTH
SPECIMEN QUALITY:: ADEQUATE

## 2019-04-20 ENCOUNTER — Encounter: Payer: Self-pay | Admitting: Family Medicine

## 2019-04-20 DIAGNOSIS — R109 Unspecified abdominal pain: Secondary | ICD-10-CM

## 2019-04-20 DIAGNOSIS — N200 Calculus of kidney: Secondary | ICD-10-CM

## 2019-04-20 NOTE — Telephone Encounter (Signed)
Let get CT to confirm dx and better see what size the stone is to make sure it will be able to pass.  CT ordered.

## 2019-04-23 ENCOUNTER — Other Ambulatory Visit: Payer: Self-pay

## 2019-04-23 ENCOUNTER — Ambulatory Visit (INDEPENDENT_AMBULATORY_CARE_PROVIDER_SITE_OTHER): Payer: Medicare Other

## 2019-04-23 DIAGNOSIS — R109 Unspecified abdominal pain: Secondary | ICD-10-CM | POA: Diagnosis not present

## 2019-04-23 DIAGNOSIS — N132 Hydronephrosis with renal and ureteral calculous obstruction: Secondary | ICD-10-CM | POA: Diagnosis not present

## 2019-04-23 NOTE — Addendum Note (Signed)
Addended by: Beatrice Lecher D on: 04/23/2019 09:19 PM   Modules accepted: Orders

## 2019-04-27 DIAGNOSIS — N200 Calculus of kidney: Secondary | ICD-10-CM | POA: Diagnosis not present

## 2019-05-02 DIAGNOSIS — N200 Calculus of kidney: Secondary | ICD-10-CM | POA: Diagnosis not present

## 2019-05-28 DIAGNOSIS — N281 Cyst of kidney, acquired: Secondary | ICD-10-CM | POA: Diagnosis not present

## 2019-05-28 DIAGNOSIS — N4289 Other specified disorders of prostate: Secondary | ICD-10-CM | POA: Diagnosis not present

## 2019-05-28 DIAGNOSIS — N200 Calculus of kidney: Secondary | ICD-10-CM | POA: Diagnosis not present

## 2019-06-02 ENCOUNTER — Other Ambulatory Visit: Payer: Self-pay | Admitting: Family Medicine

## 2019-07-08 ENCOUNTER — Other Ambulatory Visit: Payer: Self-pay | Admitting: Medical-Surgical

## 2019-07-08 DIAGNOSIS — R319 Hematuria, unspecified: Secondary | ICD-10-CM

## 2019-07-08 DIAGNOSIS — M549 Dorsalgia, unspecified: Secondary | ICD-10-CM

## 2019-07-09 ENCOUNTER — Telehealth: Payer: Self-pay | Admitting: Family Medicine

## 2019-07-09 DIAGNOSIS — E039 Hypothyroidism, unspecified: Secondary | ICD-10-CM

## 2019-07-09 DIAGNOSIS — E611 Iron deficiency: Secondary | ICD-10-CM

## 2019-07-09 DIAGNOSIS — R7301 Impaired fasting glucose: Secondary | ICD-10-CM

## 2019-07-09 DIAGNOSIS — E785 Hyperlipidemia, unspecified: Secondary | ICD-10-CM

## 2019-07-09 NOTE — Telephone Encounter (Signed)
Call patient: I did refill his medication.  We have received a request from the pharmacy that I did want to remind him that he is due to schedule a follow-up appointment with me at his convenience in the next month or 2.

## 2019-07-09 NOTE — Telephone Encounter (Signed)
Patient scheduled for medicare wellness.

## 2019-07-09 NOTE — Telephone Encounter (Signed)
Medicare wellness scheduled in July. Patient would like to get bloodwork put in so he can get it done before the appointment.   Please advise.

## 2019-07-09 NOTE — Telephone Encounter (Signed)
Routing to PCP

## 2019-07-10 NOTE — Addendum Note (Signed)
Addended by: Beatrice Lecher D on: 07/10/2019 05:39 PM   Modules accepted: Orders

## 2019-07-10 NOTE — Telephone Encounter (Signed)
Labs Entered.  Looks like he is due for 5-year recall on his colonoscopy please see if he has gotten that scheduled.

## 2019-07-11 NOTE — Telephone Encounter (Signed)
LVM asking pt to rtn call or send a my chart with this information to up date his chart.

## 2019-07-12 ENCOUNTER — Encounter: Payer: Self-pay | Admitting: Family Medicine

## 2019-07-12 NOTE — Telephone Encounter (Signed)
Vinay called back and agreed to get another colonoscopy. I called [facility] and mentioned to them he is past due. They state they will call and get him scheduled.

## 2019-07-12 NOTE — Telephone Encounter (Signed)
lvm asking pt to respond to messages.

## 2019-08-06 ENCOUNTER — Encounter: Payer: Self-pay | Admitting: Family Medicine

## 2019-08-15 DIAGNOSIS — E785 Hyperlipidemia, unspecified: Secondary | ICD-10-CM | POA: Diagnosis not present

## 2019-08-15 DIAGNOSIS — E611 Iron deficiency: Secondary | ICD-10-CM | POA: Diagnosis not present

## 2019-08-15 DIAGNOSIS — E039 Hypothyroidism, unspecified: Secondary | ICD-10-CM | POA: Diagnosis not present

## 2019-08-15 DIAGNOSIS — R7301 Impaired fasting glucose: Secondary | ICD-10-CM | POA: Diagnosis not present

## 2019-08-15 DIAGNOSIS — Z125 Encounter for screening for malignant neoplasm of prostate: Secondary | ICD-10-CM | POA: Diagnosis not present

## 2019-08-16 LAB — PSA: PSA: 0.7 ng/mL (ref <=4.0)

## 2019-08-16 LAB — CBC
HCT: 42.7 % (ref 38.5–50.0)
Hemoglobin: 14.5 g/dL (ref 13.2–17.1)
MCH: 31.9 pg (ref 27.0–33.0)
MCHC: 34 g/dL (ref 32.0–36.0)
MCV: 94.1 fL (ref 80.0–100.0)
MPV: 11.3 fL (ref 7.5–12.5)
Platelets: 205 10*3/uL (ref 140–400)
RBC: 4.54 10*6/uL (ref 4.20–5.80)
RDW: 12.5 % (ref 11.0–15.0)
WBC: 5.7 10*3/uL (ref 3.8–10.8)

## 2019-08-16 LAB — LIPID PANEL
Cholesterol: 126 mg/dL (ref <200)
HDL: 41 mg/dL (ref >=40)
LDL Cholesterol (Calc): 67 mg/dL (calc)
Non-HDL Cholesterol (Calc): 85 mg/dL (calc) (ref <130)
Total CHOL/HDL Ratio: 3.1 (calc) (ref <5.0)
Triglycerides: 96 mg/dL (ref <150)

## 2019-08-16 LAB — HEMOGLOBIN A1C
Hgb A1c MFr Bld: 5.6 % of total Hgb (ref <5.7)
Mean Plasma Glucose: 114 (calc)
eAG (mmol/L): 6.3 (calc)

## 2019-08-16 LAB — COMPLETE METABOLIC PANEL WITH GFR
AG Ratio: 2 (calc) (ref 1.0–2.5)
ALT: 16 U/L (ref 9–46)
AST: 15 U/L (ref 10–35)
Albumin: 4.7 g/dL (ref 3.6–5.1)
Alkaline phosphatase (APISO): 75 U/L (ref 35–144)
BUN: 15 mg/dL (ref 7–25)
CO2: 28 mmol/L (ref 20–32)
Calcium: 9.6 mg/dL (ref 8.6–10.3)
Chloride: 109 mmol/L (ref 98–110)
Creat: 0.98 mg/dL (ref 0.70–1.25)
GFR, Est African American: 91 mL/min/{1.73_m2} (ref >=60)
GFR, Est Non African American: 79 mL/min/{1.73_m2} (ref >=60)
Globulin: 2.3 g/dL (calc) (ref 1.9–3.7)
Glucose, Bld: 107 mg/dL — ABNORMAL HIGH (ref 65–99)
Potassium: 4.2 mmol/L (ref 3.5–5.3)
Sodium: 143 mmol/L (ref 135–146)
Total Bilirubin: 0.4 mg/dL (ref 0.2–1.2)
Total Protein: 7 g/dL (ref 6.1–8.1)

## 2019-08-16 LAB — IRON,TIBC AND FERRITIN PANEL
%SAT: 15 % (calc) — ABNORMAL LOW (ref 20–48)
Ferritin: 39 ng/mL (ref 24–380)
Iron: 51 ug/dL (ref 50–180)
TIBC: 333 mcg/dL (calc) (ref 250–425)

## 2019-08-16 LAB — TSH: TSH: 0.26 mIU/L — ABNORMAL LOW (ref 0.40–4.50)

## 2019-08-22 ENCOUNTER — Encounter: Payer: Self-pay | Admitting: Family Medicine

## 2019-08-22 ENCOUNTER — Ambulatory Visit (INDEPENDENT_AMBULATORY_CARE_PROVIDER_SITE_OTHER): Payer: Medicare Other | Admitting: Family Medicine

## 2019-08-22 ENCOUNTER — Other Ambulatory Visit: Payer: Self-pay

## 2019-08-22 VITALS — BP 124/66 | HR 60 | Ht 72.0 in | Wt 154.0 lb

## 2019-08-22 DIAGNOSIS — Z0000 Encounter for general adult medical examination without abnormal findings: Secondary | ICD-10-CM | POA: Diagnosis not present

## 2019-08-22 DIAGNOSIS — E611 Iron deficiency: Secondary | ICD-10-CM

## 2019-08-22 DIAGNOSIS — Z1211 Encounter for screening for malignant neoplasm of colon: Secondary | ICD-10-CM | POA: Diagnosis not present

## 2019-08-22 NOTE — Telephone Encounter (Signed)
No information noted in pt's chart.

## 2019-08-22 NOTE — Patient Instructions (Signed)
  Bobby Lewis , Thank you for taking time to come for your Medicare Wellness Visit. I appreciate your ongoing commitment to your health goals. Please review the following plan we discussed and let me know if I can assist you in the future.   These are the goals we discussed: Goals    . DIET - INCREASE WATER INTAKE     Work on increasing water intake to prevent kidney stones.        This is a list of the screening recommended for you and due dates:  Health Maintenance  Topic Date Due  . Colon Cancer Screening  03/29/2019  . Flu Shot  09/02/2019  . Tetanus Vaccine  08/16/2022  . COVID-19 Vaccine  Completed  .  Hepatitis C: One time screening is recommended by Center for Disease Control  (CDC) for  adults born from [birth year] through 1965.   Completed  . Pneumonia vaccines  Completed

## 2019-08-22 NOTE — Progress Notes (Signed)
Yoga and golf

## 2019-08-22 NOTE — Progress Notes (Signed)
Subjective:   Bobby Lewis is a 69 y.o. male who presents for Medicare Annual/Subsequent preventive examination.  Review of Systems    Negative ROS       Objective:    Today's Vitals   08/22/19 0824 08/22/19 0907  BP: (!) 163/50 124/66  Pulse: 60   SpO2: 99%   Weight: 154 lb (69.9 kg)   Height: 6' (1.829 m)    Body mass index is 20.89 kg/m.  Advanced Directives 07/27/2017 09/17/2014  Does Patient Have a Medical Advance Directive? Yes Yes  Type of Paramedic of [place];Living will [place];Living will  Copy of [place] in Chart? Yes No - copy requested    Current Medications (verified) Outpatient Encounter Medications as of 08/22/2019  Medication Sig  . latanoprost (XALATAN) 0.005 % ophthalmic solution 1 drop at bedtime.  Marland Kitchen atorvastatin (LIPITOR) 20 MG tablet TAKE 1 TABLET BY MOUTH EVERY DAY  . levothyroxine (SYNTHROID) 137 MCG tablet TAKE 1 TABLET (137 MCG TOTAL) BY MOUTH DAILY BEFORE BREAKFAST.  . Multiple Vitamin (MULTIVITAMIN PO) Take by mouth.    . Omega-3 Fatty Acids (FISH OIL) 1000 MG CAPS Take 2,000 mg by mouth daily.  . [DISCONTINUED] HYDROcodone-acetaminophen (NORCO) 5-325 MG tablet Take 1 tablet by mouth 2 (two) times daily as needed for moderate pain.  . [DISCONTINUED] tamsulosin (FLOMAX) 0.4 MG CAPS capsule TAKE 1 CAPSULE BY MOUTH EVERY DAY   No facility-administered encounter medications on file as of 08/22/2019.    Allergies (verified) Patient has no known allergies.   History: Past Medical History:  Diagnosis Date  . COPD (chronic obstructive pulmonary disease) ([place])   . Genital warts    history  . Tobacco abuse    Past Surgical History:  Procedure Laterality Date  . CATARACT EXTRACTION W/ INTRAOCULAR LENS IMPLANT Right 02/23/2018  . CATARACT EXTRACTION W/ INTRAOCULAR LENS IMPLANT Left 03/02/2018  . NOSE SURGERY  2002   Family History  Problem Relation Age of Onset  . Joint  hypermobility Mother   . Heart attack Father 30       died 2   Social History   Socioeconomic History  . Marital status: Married    Spouse name: Not on file  . Number of children: Not on file  . Years of education: Not on file  . Highest education level: Not on file  Occupational History  . Not on file  Tobacco Use  . Smoking status: Former Smoker    Packs/day: 1.00    Years: 25.00    Pack years: 25.00    Types: Cigarettes    Quit date: 02/01/2009    Years since quitting: 10.5  . Smokeless tobacco: Never Used  Substance and Sexual Activity  . Alcohol use: Yes    Alcohol/week: 3.0 standard drinks    Types: 3 Standard drinks or equivalent per week    Comment: daily  . Drug use: No  . 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  Other Topics Concern  . Not on file  Social History Narrative  . Not on file   Social Determinants of Health   Financial Resource Strain: Low Risk   . Difficulty of Paying Living Expenses: Not hard at all  Food Insecurity: No Food Insecurity  . Worried About Charity fundraiser in the Last Year: Never true  .  Ran Out of Food in the Last Year: Never true  Transportation Needs: No Transportation Needs  . Lack of Transportation (Medical): No  . Lack of Transportation (Non-Medical): No  Physical Activity: Unknown  . Days of Exercise per Week: 5 days  . Minutes of Exercise per Session: Not on file  Stress:   . Feeling of Stress :   Social Connections:   . Frequency of Communication with Friends and Family:   . Frequency of Social Gatherings with Friends and Family:   . Attends Religious Services:   . Active Member of Clubs or Organizations:   . Attends Archivist Meetings:   Marland Kitchen Marital Status:     Tobacco Counseling Counseling given: Not Answered   Clinical Intake: Physical Exam Constitutional:      Appearance: He is  well-developed.  HENT:     Head: Normocephalic and atraumatic.  Cardiovascular:     Rate and Rhythm: Normal rate and regular rhythm.     Heart sounds: Normal heart sounds.  Pulmonary:     Effort: Pulmonary effort is normal.     Breath sounds: Normal breath sounds.  Skin:    General: Skin is warm and dry.  Neurological:     Mental Status: He is alert and oriented to person, place, and time.  Psychiatric:        Behavior: Behavior normal.                  Diabetic?No        Activities of Daily Living In your present state of health, do you have any difficulty performing the following activities: 08/22/2019  Hearing? N  Vision? N  Difficulty concentrating or making decisions? N  Walking or climbing stairs? N  Dressing or bathing? N  Doing errands, shopping? N  Some recent data might be hidden    Patient Care Team: Hali Marry, MD as PCP - General (Family Medicine)  Indicate any recent Medical Services you may have received from other than [facility] providers in the past year (date may be approximate).     Assessment:   This is a routine wellness examination for [name].  Hearing/Vision screen No exam data present  Dietary issues and exercise activities discussed: Current Exercise Habits: Home exercise routine, Type of exercise: yoga;Other - see comments (golf), Time (Minutes): > 60, Frequency (Times/Week): 6, Weekly Exercise (Minutes/Week): 0, Intensity: Moderate  Goals    . DIET - INCREASE WATER INTAKE     Work on increasing water intake to prevent kidney stones.       Depression Screen PHQ 2/9 Scores 08/22/2019 07/27/2017 05/25/2016  PHQ - 2 Score 0 0 0    Fall Risk Fall Risk  08/22/2019 09/04/2018 07/27/2017 05/25/2016  Falls in the past year? 0 0 No No  Comment - Emmi Telephone Survey: data to providers prior to load - -  Risk for fall due to : No Fall Risks - - -     ASSISTIVE DEVICES UTILIZED TO PREVENT FALLS:  Life alert? No  Use of a  cane, walker or w/c? No    TIMED UP AND GO:  Was the test performed? No .  Length of time to ambulate 10 feet: .   Gait steady and fast without use of assistive device  Cognitive Function:     6CIT Screen 08/22/2019 07/27/2017  What Year? 0 points 0 points  What month? 0 points 0 points  What time? 0 points 0 points  Count back  from 20 0 points 0 points  Months in reverse 0 points 0 points  Repeat phrase 0 points 0 points  Total Score 0 0    Immunizations Immunization History  Administered Date(s) Administered  . Fluad Quad(high Dose 65+) 10/25/2018  . H1N1 01/31/2008  . Influenza Split 10/18/2011  . Influenza Whole 01/31/2008, 12/25/2008  . Influenza, High Dose Seasonal PF 11/11/2016, 12/15/2017  . Influenza,inj,Quad PF,6+ Mos 11/09/2012, 11/01/2013, 09/17/2014, 09/18/2015  . Influenza-Unspecified 10/25/2018  . PFIZER SARS-COV-2 Vaccination 03/24/2019, 04/16/2019  . Pneumococcal Conjugate-13 07/14/2016  . Pneumococcal Polysaccharide-23 07/27/2017  . Td 03/05/2002  . Tdap 08/15/2012  . Zoster 10/18/2011    TDAP status: Up to date Flu Vaccine status: Up to date Pneumococcal vaccine status: Up to date Covid-19 vaccine status: Completed vaccines  Qualifies for Shingles Vaccine? Yes   Zostavax completed No   Shingrix Completed?: No.    Education has been provided regarding the importance of this vaccine. Patient has been advised to call insurance company to determine out of pocket expense if they have not yet received this vaccine. Advised may also receive vaccine at local pharmacy or Health Dept. Verbalized acceptance and understanding.  Screening Tests Health Maintenance  Topic Date Due  . COLONOSCOPY  03/29/2019  . INFLUENZA VACCINE  09/02/2019  . TETANUS/TDAP  08/16/2022  . COVID-19 Vaccine  Completed  . Hepatitis C Screening  Completed  . PNA vac Low Risk Adult  Completed    Health Maintenance  Health Maintenance Due  Topic Date Due  . COLONOSCOPY   03/29/2019    Colorectal cancer screening: Referral to GI placed GAP. Pt aware the office will call re: appt.  Lung Cancer Screening: (Low Dose CT Chest recommended if Age 60-80 years, 30 pack-year currently smoking OR have quit w/in 15years.) does qualify.   Lung Cancer Screening Referral: Yes  Additional Screening:  Hepatitis C Screening: does qualify; Completed 2015  Vision Screening: Recommended annual ophthalmology exams for early detection of glaucoma and other disorders of the eye. Is the patient up to date with their annual eye exam?  Yes  Who is the provider or what is the name of the office in which the patient attends annual eye exams? Dr. Kenton Kingfisher at [facility].   If pt is not established with a provider, would they like to be referred to a provider to establish care? no  Dental Screening: Recommended annual dental exams for proper oral hygiene  Community Resource Referral / Chronic Care Management: CRR required this visit?  No   CCM required this visit?  No      Plan:     I have personally reviewed and noted the following in the patient's chart:   . Medical and social history . Use of alcohol, tobacco or illicit drugs  . Current medications and supplements . Functional ability and status . Nutritional status . Physical activity . Advanced directives . List of other physicians . Hospitalizations, surgeries, and ER visits in previous 12 months . Vitals . Screenings to include cognitive, depression, and falls . Referrals and appointments  In addition, I have reviewed and discussed with patient certain preventive protocols, quality metrics, and best practice recommendations. A written personalized care plan for preventive services as well as general preventive health recommendations were provided to patient.     Beatrice Lecher, MD   08/22/2019

## 2019-09-09 ENCOUNTER — Encounter: Payer: Self-pay | Admitting: Family Medicine

## 2019-09-09 DIAGNOSIS — Z23 Encounter for immunization: Secondary | ICD-10-CM

## 2019-09-09 DIAGNOSIS — Z87891 Personal history of nicotine dependence: Secondary | ICD-10-CM

## 2019-09-10 NOTE — Telephone Encounter (Signed)
Lung cancer screen ordered.  Please send order for Shingrix.

## 2019-09-12 DIAGNOSIS — H40053 Ocular hypertension, bilateral: Secondary | ICD-10-CM | POA: Diagnosis not present

## 2019-09-12 DIAGNOSIS — H33002 Unspecified retinal detachment with retinal break, left eye: Secondary | ICD-10-CM | POA: Diagnosis not present

## 2019-09-12 DIAGNOSIS — H43811 Vitreous degeneration, right eye: Secondary | ICD-10-CM | POA: Diagnosis not present

## 2019-09-12 DIAGNOSIS — D3131 Benign neoplasm of right choroid: Secondary | ICD-10-CM | POA: Diagnosis not present

## 2019-09-12 DIAGNOSIS — H33022 Retinal detachment with multiple breaks, left eye: Secondary | ICD-10-CM | POA: Diagnosis not present

## 2019-09-13 DIAGNOSIS — H33022 Retinal detachment with multiple breaks, left eye: Secondary | ICD-10-CM | POA: Diagnosis not present

## 2019-09-14 DIAGNOSIS — H33022 Retinal detachment with multiple breaks, left eye: Secondary | ICD-10-CM | POA: Diagnosis not present

## 2019-09-21 DIAGNOSIS — H33022 Retinal detachment with multiple breaks, left eye: Secondary | ICD-10-CM | POA: Diagnosis not present

## 2019-10-16 DIAGNOSIS — H33022 Retinal detachment with multiple breaks, left eye: Secondary | ICD-10-CM | POA: Diagnosis not present

## 2019-11-06 DIAGNOSIS — Z23 Encounter for immunization: Secondary | ICD-10-CM | POA: Diagnosis not present

## 2019-11-09 DIAGNOSIS — H43811 Vitreous degeneration, right eye: Secondary | ICD-10-CM | POA: Diagnosis not present

## 2019-11-09 DIAGNOSIS — H33022 Retinal detachment with multiple breaks, left eye: Secondary | ICD-10-CM | POA: Diagnosis not present

## 2019-11-26 DIAGNOSIS — D123 Benign neoplasm of transverse colon: Secondary | ICD-10-CM | POA: Diagnosis not present

## 2019-11-26 DIAGNOSIS — Z1211 Encounter for screening for malignant neoplasm of colon: Secondary | ICD-10-CM | POA: Diagnosis not present

## 2019-11-26 DIAGNOSIS — Z86010 Personal history of colonic polyps: Secondary | ICD-10-CM | POA: Diagnosis not present

## 2019-11-26 DIAGNOSIS — A630 Anogenital (venereal) warts: Secondary | ICD-10-CM | POA: Diagnosis not present

## 2019-11-26 DIAGNOSIS — K5730 Diverticulosis of large intestine without perforation or abscess without bleeding: Secondary | ICD-10-CM | POA: Diagnosis not present

## 2019-11-26 LAB — HM COLONOSCOPY

## 2020-01-28 ENCOUNTER — Other Ambulatory Visit: Payer: Self-pay | Admitting: Family Medicine

## 2020-01-30 ENCOUNTER — Encounter: Payer: Self-pay | Admitting: Family Medicine

## 2020-01-30 DIAGNOSIS — E039 Hypothyroidism, unspecified: Secondary | ICD-10-CM

## 2020-01-30 NOTE — Telephone Encounter (Signed)
TSH ordered for [facility] lab for follow-up.

## 2020-02-05 ENCOUNTER — Other Ambulatory Visit: Payer: Self-pay

## 2020-02-05 DIAGNOSIS — E039 Hypothyroidism, unspecified: Secondary | ICD-10-CM

## 2020-02-06 ENCOUNTER — Encounter: Payer: Self-pay | Admitting: Family Medicine

## 2020-02-06 LAB — TSH: TSH: 0.66 mIU/L (ref 0.40–4.50)

## 2020-02-11 ENCOUNTER — Encounter: Payer: Self-pay | Admitting: Family Medicine

## 2020-02-12 MED ORDER — LEVOTHYROXINE SODIUM 137 MCG PO TABS: 137.0000 ug | ORAL_TABLET | Freq: Every day | ORAL | 1 refills | Status: DC

## 2020-02-18 ENCOUNTER — Encounter: Payer: Self-pay | Admitting: Family Medicine

## 2020-02-19 ENCOUNTER — Encounter: Payer: Self-pay | Admitting: Family Medicine

## 2020-02-19 NOTE — Telephone Encounter (Signed)
Bobby Lewis - I don't really know what to tell this guy.

## 2020-02-25 ENCOUNTER — Ambulatory Visit: Payer: Medicare Other | Admitting: Family Medicine

## 2020-04-16 DIAGNOSIS — D123 Benign neoplasm of transverse colon: Secondary | ICD-10-CM | POA: Diagnosis not present

## 2020-04-16 DIAGNOSIS — Z86010 Personal history of colonic polyps: Secondary | ICD-10-CM | POA: Diagnosis not present

## 2020-04-16 DIAGNOSIS — K5730 Diverticulosis of large intestine without perforation or abscess without bleeding: Secondary | ICD-10-CM | POA: Diagnosis not present

## 2020-04-16 DIAGNOSIS — Z1211 Encounter for screening for malignant neoplasm of colon: Secondary | ICD-10-CM | POA: Diagnosis not present

## 2020-04-16 DIAGNOSIS — K635 Polyp of colon: Secondary | ICD-10-CM | POA: Diagnosis not present

## 2020-04-16 LAB — HM COLONOSCOPY

## 2020-04-22 ENCOUNTER — Encounter: Payer: Self-pay | Admitting: Family Medicine

## 2020-05-28 DIAGNOSIS — Z23 Encounter for immunization: Secondary | ICD-10-CM | POA: Diagnosis not present

## 2020-07-24 ENCOUNTER — Other Ambulatory Visit: Payer: Self-pay | Admitting: Family Medicine

## 2020-08-04 ENCOUNTER — Other Ambulatory Visit: Payer: Self-pay | Admitting: Family Medicine
